# Patient Record
Sex: Male | Born: 1965 | Race: Black or African American | Hispanic: No | Marital: Single | Smoking: Never smoker
Health system: Southern US, Community
[De-identification: ages and names within clinical notes are randomized; demographics above are authoritative.]

## PROBLEM LIST (undated history)

## (undated) DIAGNOSIS — F29 Unspecified psychosis not due to a substance or known physiological condition: Secondary | ICD-10-CM

## (undated) DIAGNOSIS — E785 Hyperlipidemia, unspecified: Secondary | ICD-10-CM

## (undated) DIAGNOSIS — F1910 Other psychoactive substance abuse, uncomplicated: Secondary | ICD-10-CM

## (undated) DIAGNOSIS — M71122 Other infective bursitis, left elbow: Secondary | ICD-10-CM

## (undated) DIAGNOSIS — F32A Depression, unspecified: Secondary | ICD-10-CM

## (undated) DIAGNOSIS — A549 Gonococcal infection, unspecified: Secondary | ICD-10-CM

## (undated) DIAGNOSIS — F319 Bipolar disorder, unspecified: Secondary | ICD-10-CM

## (undated) DIAGNOSIS — G43909 Migraine, unspecified, not intractable, without status migrainosus: Secondary | ICD-10-CM

## (undated) DIAGNOSIS — F329 Major depressive disorder, single episode, unspecified: Secondary | ICD-10-CM

## (undated) DIAGNOSIS — K635 Polyp of colon: Secondary | ICD-10-CM

## (undated) DIAGNOSIS — F22 Delusional disorders: Secondary | ICD-10-CM

## (undated) DIAGNOSIS — G40909 Epilepsy, unspecified, not intractable, without status epilepticus: Secondary | ICD-10-CM

## (undated) DIAGNOSIS — M1990 Unspecified osteoarthritis, unspecified site: Secondary | ICD-10-CM

## (undated) DIAGNOSIS — E119 Type 2 diabetes mellitus without complications: Secondary | ICD-10-CM

## (undated) HISTORY — DX: Unspecified osteoarthritis, unspecified site: M19.90

## (undated) HISTORY — DX: Delusional disorders: F22

## (undated) HISTORY — DX: Polyp of colon: K63.5

## (undated) HISTORY — PX: ESOPHAGOGASTRODUODENOSCOPY: SHX1529

## (undated) HISTORY — PX: SKULL FRACTURE ELEVATION: SHX781

## (undated) HISTORY — DX: Gonococcal infection, unspecified: A54.9

## (undated) HISTORY — DX: Hyperlipidemia, unspecified: E78.5

## (undated) HISTORY — DX: Depression, unspecified: F32.A

## (undated) HISTORY — DX: Epilepsy, unspecified, not intractable, without status epilepticus: G40.909

## (undated) HISTORY — DX: Major depressive disorder, single episode, unspecified: F32.9

## (undated) HISTORY — DX: Unspecified psychosis not due to a substance or known physiological condition: F29

## (undated) HISTORY — DX: Other psychoactive substance abuse, uncomplicated: F19.10

---

## 1998-01-13 ENCOUNTER — Emergency Department (HOSPITAL_COMMUNITY): Admission: EM | Admit: 1998-01-13 | Discharge: 1998-01-13 | Payer: Self-pay | Admitting: Emergency Medicine

## 1998-01-14 ENCOUNTER — Emergency Department (HOSPITAL_COMMUNITY): Admission: EM | Admit: 1998-01-14 | Discharge: 1998-01-14 | Payer: Self-pay | Admitting: Emergency Medicine

## 1998-02-09 ENCOUNTER — Ambulatory Visit (HOSPITAL_COMMUNITY)
Admission: RE | Admit: 1998-02-09 | Discharge: 1998-02-09 | Payer: Self-pay | Admitting: Thoracic Surgery (Cardiothoracic Vascular Surgery)

## 2001-04-22 ENCOUNTER — Emergency Department (HOSPITAL_COMMUNITY): Admission: EM | Admit: 2001-04-22 | Discharge: 2001-04-22 | Payer: Self-pay | Admitting: Emergency Medicine

## 2001-04-22 ENCOUNTER — Encounter: Payer: Self-pay | Admitting: Emergency Medicine

## 2001-04-24 ENCOUNTER — Emergency Department (HOSPITAL_COMMUNITY): Admission: EM | Admit: 2001-04-24 | Discharge: 2001-04-24 | Payer: Self-pay | Admitting: *Deleted

## 2001-05-01 ENCOUNTER — Emergency Department (HOSPITAL_COMMUNITY): Admission: EM | Admit: 2001-05-01 | Discharge: 2001-05-01 | Payer: Self-pay | Admitting: Emergency Medicine

## 2001-12-11 ENCOUNTER — Encounter: Payer: Self-pay | Admitting: Emergency Medicine

## 2001-12-11 ENCOUNTER — Emergency Department (HOSPITAL_COMMUNITY): Admission: EM | Admit: 2001-12-11 | Discharge: 2001-12-11 | Payer: Self-pay | Admitting: Emergency Medicine

## 2002-02-06 ENCOUNTER — Emergency Department (HOSPITAL_COMMUNITY): Admission: EM | Admit: 2002-02-06 | Discharge: 2002-02-07 | Payer: Self-pay | Admitting: Emergency Medicine

## 2002-09-09 ENCOUNTER — Emergency Department (HOSPITAL_COMMUNITY): Admission: EM | Admit: 2002-09-09 | Discharge: 2002-09-09 | Payer: Self-pay | Admitting: Emergency Medicine

## 2002-09-25 ENCOUNTER — Emergency Department (HOSPITAL_COMMUNITY): Admission: EM | Admit: 2002-09-25 | Discharge: 2002-09-25 | Payer: Self-pay | Admitting: Emergency Medicine

## 2002-10-19 ENCOUNTER — Emergency Department (HOSPITAL_COMMUNITY): Admission: EM | Admit: 2002-10-19 | Discharge: 2002-10-19 | Payer: Self-pay | Admitting: Emergency Medicine

## 2002-11-12 ENCOUNTER — Emergency Department (HOSPITAL_COMMUNITY): Admission: EM | Admit: 2002-11-12 | Discharge: 2002-11-12 | Payer: Self-pay | Admitting: Emergency Medicine

## 2002-11-21 ENCOUNTER — Emergency Department (HOSPITAL_COMMUNITY): Admission: EM | Admit: 2002-11-21 | Discharge: 2002-11-21 | Payer: Self-pay | Admitting: *Deleted

## 2002-12-17 ENCOUNTER — Emergency Department (HOSPITAL_COMMUNITY): Admission: EM | Admit: 2002-12-17 | Discharge: 2002-12-17 | Payer: Self-pay | Admitting: Emergency Medicine

## 2003-01-16 ENCOUNTER — Emergency Department (HOSPITAL_COMMUNITY): Admission: EM | Admit: 2003-01-16 | Discharge: 2003-01-16 | Payer: Self-pay

## 2005-07-16 ENCOUNTER — Emergency Department (HOSPITAL_COMMUNITY): Admission: EM | Admit: 2005-07-16 | Discharge: 2005-07-16 | Payer: Self-pay | Admitting: Emergency Medicine

## 2007-02-19 ENCOUNTER — Emergency Department (HOSPITAL_COMMUNITY): Admission: EM | Admit: 2007-02-19 | Discharge: 2007-02-19 | Payer: Self-pay | Admitting: Emergency Medicine

## 2009-05-20 ENCOUNTER — Emergency Department (HOSPITAL_COMMUNITY): Admission: EM | Admit: 2009-05-20 | Discharge: 2009-05-20 | Payer: Self-pay | Admitting: Emergency Medicine

## 2009-05-20 ENCOUNTER — Emergency Department (HOSPITAL_COMMUNITY): Admission: EM | Admit: 2009-05-20 | Discharge: 2009-05-21 | Payer: Self-pay | Admitting: Emergency Medicine

## 2009-05-28 ENCOUNTER — Emergency Department (HOSPITAL_COMMUNITY): Admission: EM | Admit: 2009-05-28 | Discharge: 2009-05-28 | Payer: Self-pay | Admitting: Emergency Medicine

## 2009-06-03 ENCOUNTER — Emergency Department (HOSPITAL_COMMUNITY): Admission: EM | Admit: 2009-06-03 | Discharge: 2009-06-03 | Payer: Self-pay | Admitting: Emergency Medicine

## 2009-06-08 ENCOUNTER — Emergency Department (HOSPITAL_COMMUNITY): Admission: EM | Admit: 2009-06-08 | Discharge: 2009-06-08 | Payer: Self-pay | Admitting: Emergency Medicine

## 2009-07-03 ENCOUNTER — Emergency Department (HOSPITAL_COMMUNITY): Admission: EM | Admit: 2009-07-03 | Discharge: 2009-07-03 | Payer: Self-pay | Admitting: Emergency Medicine

## 2009-07-23 ENCOUNTER — Emergency Department (HOSPITAL_COMMUNITY): Admission: EM | Admit: 2009-07-23 | Discharge: 2009-07-23 | Payer: Self-pay | Admitting: Emergency Medicine

## 2009-08-01 ENCOUNTER — Emergency Department (HOSPITAL_COMMUNITY): Admission: EM | Admit: 2009-08-01 | Discharge: 2009-08-01 | Payer: Self-pay | Admitting: Emergency Medicine

## 2009-08-04 ENCOUNTER — Emergency Department (HOSPITAL_COMMUNITY): Admission: EM | Admit: 2009-08-04 | Discharge: 2009-08-05 | Payer: Self-pay | Admitting: Emergency Medicine

## 2009-08-05 ENCOUNTER — Encounter (INDEPENDENT_AMBULATORY_CARE_PROVIDER_SITE_OTHER): Payer: Self-pay | Admitting: *Deleted

## 2009-08-31 ENCOUNTER — Encounter (INDEPENDENT_AMBULATORY_CARE_PROVIDER_SITE_OTHER): Payer: Self-pay | Admitting: *Deleted

## 2009-08-31 ENCOUNTER — Ambulatory Visit: Payer: Self-pay | Admitting: Internal Medicine

## 2009-09-02 ENCOUNTER — Telehealth: Payer: Self-pay | Admitting: Internal Medicine

## 2009-09-03 ENCOUNTER — Telehealth: Payer: Self-pay | Admitting: Internal Medicine

## 2009-09-04 ENCOUNTER — Ambulatory Visit: Payer: Self-pay | Admitting: Internal Medicine

## 2009-09-04 HISTORY — PX: COLONOSCOPY: SHX174

## 2009-09-08 ENCOUNTER — Encounter: Payer: Self-pay | Admitting: Internal Medicine

## 2009-09-11 ENCOUNTER — Emergency Department (HOSPITAL_COMMUNITY): Admission: EM | Admit: 2009-09-11 | Discharge: 2009-09-11 | Payer: Self-pay | Admitting: Emergency Medicine

## 2009-09-16 ENCOUNTER — Telehealth: Payer: Self-pay | Admitting: Internal Medicine

## 2009-09-21 ENCOUNTER — Telehealth: Payer: Self-pay | Admitting: Internal Medicine

## 2009-09-21 DIAGNOSIS — F329 Major depressive disorder, single episode, unspecified: Secondary | ICD-10-CM | POA: Insufficient documentation

## 2009-09-21 DIAGNOSIS — R079 Chest pain, unspecified: Secondary | ICD-10-CM

## 2009-09-21 DIAGNOSIS — R519 Headache, unspecified: Secondary | ICD-10-CM | POA: Insufficient documentation

## 2009-09-21 DIAGNOSIS — R51 Headache: Secondary | ICD-10-CM

## 2009-09-22 ENCOUNTER — Telehealth: Payer: Self-pay | Admitting: Internal Medicine

## 2009-09-22 ENCOUNTER — Telehealth: Payer: Self-pay | Admitting: Cardiovascular Disease

## 2009-09-29 ENCOUNTER — Encounter: Admission: RE | Admit: 2009-09-29 | Discharge: 2009-09-29 | Payer: Self-pay | Admitting: Geriatric Medicine

## 2009-09-29 ENCOUNTER — Ambulatory Visit: Payer: Self-pay | Admitting: Cardiovascular Disease

## 2009-10-02 ENCOUNTER — Emergency Department (HOSPITAL_COMMUNITY): Admission: EM | Admit: 2009-10-02 | Discharge: 2009-10-02 | Payer: Self-pay | Admitting: Emergency Medicine

## 2009-10-09 ENCOUNTER — Emergency Department (HOSPITAL_COMMUNITY): Admission: EM | Admit: 2009-10-09 | Discharge: 2009-10-09 | Payer: Self-pay | Admitting: Emergency Medicine

## 2009-10-20 ENCOUNTER — Encounter (INDEPENDENT_AMBULATORY_CARE_PROVIDER_SITE_OTHER): Payer: Self-pay | Admitting: *Deleted

## 2009-10-21 ENCOUNTER — Ambulatory Visit (HOSPITAL_COMMUNITY): Admission: RE | Admit: 2009-10-21 | Discharge: 2009-10-21 | Payer: Self-pay | Admitting: Specialist

## 2009-10-26 ENCOUNTER — Telehealth: Payer: Self-pay | Admitting: Internal Medicine

## 2009-10-29 ENCOUNTER — Emergency Department (HOSPITAL_COMMUNITY): Admission: EM | Admit: 2009-10-29 | Discharge: 2009-10-29 | Payer: Self-pay | Admitting: Emergency Medicine

## 2009-11-02 ENCOUNTER — Ambulatory Visit: Payer: Self-pay | Admitting: Internal Medicine

## 2009-11-02 ENCOUNTER — Ambulatory Visit (HOSPITAL_COMMUNITY): Admission: RE | Admit: 2009-11-02 | Discharge: 2009-11-02 | Payer: Self-pay | Admitting: Cardiovascular Disease

## 2009-11-02 ENCOUNTER — Ambulatory Visit: Payer: Self-pay | Admitting: Infectious Disease

## 2009-11-02 ENCOUNTER — Encounter: Payer: Self-pay | Admitting: Cardiovascular Disease

## 2009-11-02 ENCOUNTER — Ambulatory Visit: Payer: Self-pay

## 2009-11-02 DIAGNOSIS — F29 Unspecified psychosis not due to a substance or known physiological condition: Secondary | ICD-10-CM | POA: Insufficient documentation

## 2009-11-02 DIAGNOSIS — F1910 Other psychoactive substance abuse, uncomplicated: Secondary | ICD-10-CM | POA: Insufficient documentation

## 2009-11-02 DIAGNOSIS — L290 Pruritus ani: Secondary | ICD-10-CM

## 2009-11-11 ENCOUNTER — Ambulatory Visit: Payer: Self-pay | Admitting: Infectious Disease

## 2009-11-11 LAB — CONVERTED CEMR LAB
Albumin: 4.5 g/dL (ref 3.5–5.2)
Alkaline Phosphatase: 102 units/L (ref 39–117)
BUN: 11 mg/dL (ref 6–23)
Basophils Relative: 1 % (ref 0–1)
Chloride: 105 meq/L (ref 96–112)
Eosinophils Absolute: 0.2 10*3/uL (ref 0.0–0.7)
HCT: 38.4 % — ABNORMAL LOW (ref 39.0–52.0)
HCV Ab: NEGATIVE
HIV 1 RNA Quant: <48 copies/mL (ref <48)
HIV-1 RNA Quant, Log: <1.68 (ref <1.68)
Hemoglobin: 12.5 g/dL — ABNORMAL LOW (ref 13.0–17.0)
Hepatitis B-Post: 0 milliintl units/mL
Lymphs Abs: 3.2 10*3/uL (ref 0.7–4.0)
MCV: 89.5 fL (ref 78.0–100.0)
Monocytes Absolute: 0.6 10*3/uL (ref 0.1–1.0)
Monocytes Relative: 10 % (ref 3–12)
RBC: 4.29 M/uL (ref 4.22–5.81)
Total Bilirubin: 0.3 mg/dL (ref 0.3–1.2)
Total Protein: 7.2 g/dL (ref 6.0–8.3)
WBC: 6.3 10*3/uL (ref 4.0–10.5)

## 2009-11-15 ENCOUNTER — Emergency Department (HOSPITAL_COMMUNITY): Admission: EM | Admit: 2009-11-15 | Discharge: 2009-11-15 | Payer: Self-pay | Admitting: Emergency Medicine

## 2009-11-16 ENCOUNTER — Telehealth: Payer: Self-pay | Admitting: Infectious Disease

## 2009-11-19 ENCOUNTER — Telehealth: Payer: Self-pay | Admitting: Internal Medicine

## 2009-11-19 ENCOUNTER — Emergency Department (HOSPITAL_COMMUNITY): Admission: EM | Admit: 2009-11-19 | Discharge: 2009-11-19 | Payer: Self-pay | Admitting: Emergency Medicine

## 2009-12-23 ENCOUNTER — Ambulatory Visit: Payer: Self-pay | Admitting: Internal Medicine

## 2010-01-25 ENCOUNTER — Emergency Department (HOSPITAL_COMMUNITY): Admission: EM | Admit: 2010-01-25 | Discharge: 2010-01-25 | Payer: Self-pay | Admitting: Emergency Medicine

## 2010-02-12 ENCOUNTER — Emergency Department (HOSPITAL_COMMUNITY): Admission: EM | Admit: 2010-02-12 | Discharge: 2010-02-12 | Payer: Self-pay | Admitting: Emergency Medicine

## 2010-02-20 ENCOUNTER — Emergency Department (HOSPITAL_COMMUNITY): Admission: EM | Admit: 2010-02-20 | Discharge: 2010-02-20 | Payer: Self-pay | Admitting: Emergency Medicine

## 2010-02-23 ENCOUNTER — Encounter: Admission: RE | Admit: 2010-02-23 | Discharge: 2010-02-23 | Payer: Self-pay | Admitting: Family Medicine

## 2010-02-23 ENCOUNTER — Emergency Department (HOSPITAL_COMMUNITY): Admission: EM | Admit: 2010-02-23 | Discharge: 2010-02-23 | Payer: Self-pay | Admitting: Emergency Medicine

## 2010-04-19 ENCOUNTER — Emergency Department (HOSPITAL_COMMUNITY): Admission: EM | Admit: 2010-04-19 | Discharge: 2010-04-19 | Payer: Self-pay | Admitting: Emergency Medicine

## 2010-04-20 ENCOUNTER — Emergency Department (HOSPITAL_COMMUNITY): Admission: EM | Admit: 2010-04-20 | Discharge: 2010-04-20 | Payer: Self-pay | Admitting: Family Medicine

## 2010-05-16 ENCOUNTER — Emergency Department (HOSPITAL_COMMUNITY): Admission: EM | Admit: 2010-05-16 | Discharge: 2010-05-16 | Payer: Self-pay | Admitting: Emergency Medicine

## 2010-05-22 ENCOUNTER — Emergency Department (HOSPITAL_COMMUNITY): Admission: EM | Admit: 2010-05-22 | Discharge: 2010-05-22 | Payer: Self-pay | Admitting: Emergency Medicine

## 2010-05-31 ENCOUNTER — Telehealth: Payer: Self-pay | Admitting: Internal Medicine

## 2010-08-22 ENCOUNTER — Encounter: Payer: Self-pay | Admitting: Geriatric Medicine

## 2010-09-02 NOTE — Procedures (Signed)
Summary: Colonoscopy  Patient: Jeremiah Mcguire Note: All result statuses are Final unless otherwise noted.  Tests: (1) Colonoscopy (COL)   COL Colonoscopy           DONE (C)     [facility]     [address]     [place], [place]  [address]           COLONOSCOPY PROCEDURE REPORT           PATIENT:  Jeremiah Mcguire, Jeremiah Mcguire  MR#:  [id number]     BIRTHDATE:  [birth year], 43 yrs. old  GENDER:  male           ENDOSCOPIST:  Iva Boop, MD, [facility]     Referred by:           Emergency Department           PROCEDURE DATE:  09/04/2009     PROCEDURE:  Colonoscopy with biopsy     ASA CLASS:  Class I     INDICATIONS:  rectal pain     he is also 43 and African-American so total colonoscopy planned     so that he will be screened for colorectalpolyps/cancer           MEDICATIONS:   Fentanyl 50 mg IV, Versed 7 mg IV           DESCRIPTION OF PROCEDURE:   After the risks benefits and     alternatives of the procedure were thoroughly explained, informed     consent was obtained.  Digital rectal exam was performed and     revealed no abnormalities and normal prostate.   The LB CF-H180AL     [id number] endoscope was introduced through the anus and advanced to     the cecum, which was identified by both the appendix and ileocecal     valve, without limitations.  The quality of the prep was     excellent, using MoviPrep.  The instrument was then slowly     withdrawn as the colon was fully examined.     Insertion: 2:08 minutes Withdrawal: 9:57 minutes ADDENDUM: Rectal     photo inadvertantly omitted     <<PROCEDUREIMAGES>>           FINDINGS:  Three polyps were found in the left colon. One in     descending and two in sigmoid. Minimum 2mm and maximum 4 mm size.     The polyps were removed using cold biopsy forceps.  This was     otherwise a normal examination of the colon.   Retroflexed views     in the rectum revealed no abnormalities.    The scope was then     withdrawn from the patient  and the procedure completed.           COMPLICATIONS:  None           ENDOSCOPIC IMPRESSION:     1) Three diminutive polyps in the left colon - removed     2) Otherwise normal examination, excellent prep           3) No cause of his pain or other somatosensory symptoms seen. Do     not think kis problems orginate from the gastrointestinal tract.           RECOMMENDATIONS:     Return to primary care physician for further evaluation. ? if     some or all of symproms are related to medications.  REPEAT EXAM:  In for Colonoscopy, pending biopsy results.           Iva Boop, MD, [facility]           CC:  The Patient     Reymundo Poll, MD           n.     REVISED:  09/04/2009 12:30 PM     eSIGNED:   Iva Boop at 09/04/2009 12:30 PM           Roslynn Amble, [id number]  Note: An exclamation mark (!) indicates a result that was not dispersed into the flowsheet. Document Creation Date: 09/04/2009 12:30 PM _______________________________________________________________________  (1) Order result status: Final Collection or observation date-time: 09/04/2009 12:15 Requested date-time:  Receipt date-time:  Reported date-time:  Referring Physician:   Ordering Physician: Stan Head [phone]) Specimen Source:  Source: Launa Grill Order Number: [id number] Lab site:   Appended Document: Colonoscopy     Procedures Next Due Date:    Colonoscopy: 09/2019

## 2010-09-02 NOTE — Letter (Signed)
Summary: [facility] Instructions  [facility]  [address] [place], [place] [address]   Phone: [phone]  Fax: [phone]       Jeremiah Mcguire    02-Jun-1966    MRN: [id number]        Procedure Day Dorna Bloom: Friday, 09/04/09     Arrival Time: 10:30 am      Procedure Time: 11:30 am     Location of Procedure:                    _X_  [facility] (4th Floor)                       PREPARATION FOR COLONOSCOPY WITH MOVIPREP   Starting 5 days prior to your procedure TODAY do not eat nuts, seeds, popcorn, corn, beans, peas,  salads, or any raw vegetables.  Do not take any fiber supplements (e.g. Metamucil, Citrucel, and Benefiber).  THE DAY BEFORE YOUR PROCEDURE         DATE: 09/03/09     DAY: Thursday  1.  Drink clear liquids the entire day-NO SOLID FOOD  2.  Do not drink anything colored red or purple.  Avoid juices with pulp.  No orange juice.  3.  Drink at least 64 oz. (8 glasses) of fluid/clear liquids during the day to prevent dehydration and help the prep work efficiently.  CLEAR LIQUIDS INCLUDE: Water Jello Ice Popsicles Tea (sugar ok, no milk/cream) Powdered fruit flavored drinks Coffee (sugar ok, no milk/cream) Gatorade Juice: apple, white grape, white cranberry  Lemonade Clear bullion, consomm, broth Carbonated beverages (any kind) Strained chicken noodle soup Hard Candy                             4.  In the morning, mix first dose of MoviPrep solution:    Empty 1 Pouch A and 1 Pouch B into the disposable container    Add lukewarm drinking water to the top line of the container. Mix to dissolve    Refrigerate (mixed solution should be used within 24 hrs)  5.  Begin drinking the prep at 5:00 p.m. The MoviPrep container is divided by 4 marks.   Every 15 minutes drink the solution down to the next mark (approximately 8 oz) until the full liter is complete.   6.  Follow completed prep with 16 oz of clear liquid of your choice  (Nothing red or purple).  Continue to drink clear liquids until bedtime.  7.  Before going to bed, mix second dose of MoviPrep solution:    Empty 1 Pouch A and 1 Pouch B into the disposable container    Add lukewarm drinking water to the top line of the container. Mix to dissolve    Refrigerate  THE DAY OF YOUR PROCEDURE      DATE: 09/04/09    DAY: Friday  Beginning at 6:30a.m. (5 hours before procedure):         1. Every 15 minutes, drink the solution down to the next mark (approx 8 oz) until the full liter is complete.  2. Follow completed prep with 16 oz. of clear liquid of your choice.    3. You may drink clear liquids until 9:30am (2 HOURS BEFORE PROCEDURE).   MEDICATION INSTRUCTIONS  Unless otherwise instructed, you should take regular prescription medications with a small sip of water   as early as possible  the morning of your procedure.  Additional medication instructions: None         OTHER INSTRUCTIONS  You will need a responsible adult at least 47 years of age to accompany you and drive you home.   This person must remain in the waiting room during your procedure.  Wear loose fitting clothing that is easily removed.  Leave jewelry and other valuables at home.  However, you may wish to bring a book to read or  an iPod/MP3 player to listen to music as you wait for your procedure to start.  Remove all body piercing jewelry and leave at home.  Total time from sign-in until discharge is approximately 2-3 hours.  You should go home directly after your procedure and rest.  You can resume normal activities the  day after your procedure.  The day of your procedure you should not:   Drive   Make legal decisions   Operate machinery   Drink alcohol   Return to work  You will receive specific instructions about eating, activities and medications before you leave.    The above instructions have been reviewed and explained to me by   Francee Piccolo CMA  Duncan Dull)  August 31, 2009 11:21 AM    I fully understand and can verbalize these instructions _____________________________ Date _________

## 2010-09-02 NOTE — Letter (Signed)
Summary: Patient Notice-Hyperplastic Polyps  [facility]  [address] [place], [place] [address]   Phone: [phone]  Fax: [phone]        September 08, 2009 MRN: [id number]    Jeremiah Mcguire [address] [place], [place]  [address]    Dear Jeremiah Mcguire,  I am pleased to inform you that the colon polyp(s) removed during your recent colonoscopy was (were) found to be hyperplastic.  These types of polyps are NOT pre-cancerous.  It is therefore my recommendation that you have a repeat colonoscopy examination in 10 years for routine colorectal cancer screening.  Should you develop new or worsening symptoms of abdominal pain, bowel habit changes or bleeding from the rectum or bowels, please schedule an evaluation with either your primary care physician or with me.  No further action with gastroenterology is needed at this time.      Please follow-up with your primary care physician for your other      healthcare needs.  Please call us if you are having persistent problems or have questions about your condition that have not been fully answered at this time.  Sincerely,  Iva Boop MD, [facility] This letter has been electronically signed by your physician.  Appended Document: Patient Notice-Hyperplastic Polyps letter mailed 2.11.11

## 2010-09-02 NOTE — Progress Notes (Signed)
Summary: doctor switch request  Phone Note Call from Patient Call back at Home Phone [phone]   Caller: Patient Call For: Dr. Leone Payor Reason for Call: Talk to Nurse Summary of Call: pt called to sch an appt for rectal bleeding and sharp/stiinging rectal pain... upon going to sch pt, saw that pt has been discharged from [facility] practice although no notes in EMR confirming such... Sheri advised to tell pt that he must have PCP contact Dr. Leone Payor directly for future appts... pt then said that he didnt want to see Dr. Leone Payor because Dr. Leone Payor was "saying things he didnt want to hear"... pt asked to be scheduled with someone else within our practice Initial call taken by: Vallarie Mare,  May 31, 2010 9:08 AM  Follow-up for Phone Call        Dr Leone Payor I have not returned a call to the patient.  We need to clarify if he has been discharged from out practice.  Please advise. 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 He should be seen only if primary care had screened him first. I would not let him switch to  anyone else - best if I handle it at this time since I know whole history. If he wants to switch he will have to find another practice  to see.   Additional Follow-up by: Iva Boop MD, [facility],  June 01, 2010 5:14 AM    Additional Follow-up for Phone Call Additional follow up Details #2::    Patient  advised that he will not be able to switch physicians in our practice.  He states he does not want to see Dr Leone Payor but wants to be seen in our practice.  Patient  is advised he will need to find another GI practice if that is his desire.  He is advised to see his primary care or check the phone book for another GI practice.   Follow-up by: Darcey Nora RN, CGRN,  June 01, 2010 9:04 AM

## 2010-09-02 NOTE — Assessment & Plan Note (Signed)
Summary: ABDOMINAL PAIN--CH.   History of Present Illness Visit Type: new patient  Primary GI MD: Stan Head MD [facility] [place] Primary Provider: Reymundo Poll, MD  Requesting Provider: n/a Chief Complaint: rectal pain History of Present Illness:   This is a 45 year old African American man I was asked to see regarding rectal pain. He was evaluated in the [facility] emergency department on August 01, 2009 with a normal rectal exam, and subsequently was seen on August 05, 2009 because he had paresthesias in his staining sensation all over his body. Laboratory testing and showed a normal CBC urinalysis, drug screen was positive for THC. Seeing that was normal except for AST elevated at 38, only one .abnormal. Alcohol less than 5. He has been preoccupied that he has worse crawling over his body. He believes that his burning sensation he feels from his scalp going down into his body and including his rectal and buttock area is related to this. It began since he was released from prison in September October 2010. Apparently he has taken multiple rounds of Vermox and other anti-helminthic medications.  He also describes a bubbling up inside chest at times.  3 weeks ago - has not recurred - felt clogged up in his throat- not while eating. this occurred and remitted spontaneously.  No rectal bleeding, discharge or change in bowels. No abdominal pain. All other GI ROS negative.            Current Medications (verified): 1)  Hydroxyzine Pamoate 50 Mg Caps (Hydroxyzine Pamoate) .... One Capsule By Mouth At Bedtime 2)  Seroquel 300 Mg Tabs (Quetiapine Fumarate) .... One Tablet By Mouth Two Times A Day 3)  Benztropine Mesylate 1 Mg Tabs (Benztropine Mesylate) .... One Tablet By Mouth Two Times A Day  Allergies (verified): No Known Drug Allergies  Past History:  Past Medical History: Chronic Headaches Depression  Past Surgical History: Surgery on Head x 3   Family History: No FH of Colon  Cancer: Family History of Diabetes: Maternal Uncle  Stomach Cancer: Maternal Cousin  Throat Cancer: Maternal Uncle  Social History: Occupation: Disability Incarcerated 3 times, last release 10/10 No children Patient has never smoked.  Alcohol Use - no Illicit Drug Use - yes- marijuana Smoking Status:  never Drug Use:  no  Review of Systems       The patient complains of back pain, change in vision, confusion, cough, depression-new, headaches-new, hearing problems, itching, muscle pains/cramps, nosebleeds, shortness of breath, skin rash, sleeping problems, sore throat, and swelling of feet/legs.         All other ROS negative except as per HPI.   Vital Signs:  Patient profile:   45 year old male Height:      71 inches Weight:      243 pounds BMI:     34.01 BSA:     2.29 Pulse rate:   76 / minute Pulse rhythm:   regular BP sitting:   124 / 82  (left arm) Cuff size:   regular  Vitals Entered By: Ok Anis CMA (August 31, 2009 10:30 AM)  Physical Exam  General:  Well developed, well nourished, no acute distress. Eyes:  PERRLA, no icterus. Mouth:  No deformity or lesions, dentition normal. Lungs:  Clear throughout to auscultation. Heart:  Regular rate and rhythm; no murmurs, rubs,  or bruits. Abdomen:  Soft, nontender and nondistended. No masses, hepatosplenomegaly or hernias noted. Normal bowel sounds. Rectal:  deferred until time of colonoscopy.   peranal inspection without  lesions Extremities:  No edema  Psych:  answers ?'s appropriately ? some bizarre thought re" pre-ocupation with worms cooperative and pleasant   Impression & Recommendations:  Problem # 1:  RECTAL PAIN (ONG-295.28) Assessment New parasthesia-like pain ? related to meds - ? if he is actually schizophrenic ? transient throat-closing is a s ide-effect of meds also  will investigate with endoscopy to rule-out other problems, plus he is 43 and reasoable to screen for colon cancer Risks,  benefits,and indications of endoscopic procedure(s) were reviewed with the patient and all questions answered. Will try to reach ACT Team or his psychiatrist/PCP about this   Orders: Colonoscopy (Colon)  Patient Instructions: 1)  Moviprep instructions for colonoscopy provided. 2)  The medication list was reviewed and reconciled.  All changed / newly prescribed medications were explained.  A complete medication list was provided to the patient / caregiver.

## 2010-09-02 NOTE — Progress Notes (Signed)
Summary: Triage  Phone Note Call from Patient Call back at Home Phone [phone]   Caller: Patient Call For: Dr. Leone Payor Reason for Call: Talk to Nurse Summary of Call: Pt has some questions about his colonoscopy and he still feels "Parasites" and does not understand why Dr. Leone Payor did not find anything with his colonoscopy Initial call taken by: Karna Christmas,  September 22, 2009 10:27 AM  Follow-up for Phone Call        Pt. states he has parasites in his body and they are spreading into his head and they are biting him all over. Insists that Dr.Philbert Ocallaghan needs to find these parasites. Pt. states that he is taking parasite medicine and was told by another MD that he had them. I advise pt. that from a GI standpoint he is clear of parasites and advised him to return to the MD who told him he had the parasites for further treatment. Pt. instructed to call back as needed.   DR.GEESSNER PLEASE FURTHER ADVISE  Follow-up by: Laureen Ochs LPN,  September 22, 2009 10:47 AM  Additional Follow-up for Phone Call Additional follow up Details #1::        you gave correct advice will see if he has seen his psych provider I will have Judeth Cornfield follow-up as we know him thanks Additional Follow-up by: Iva Boop MD, [facility],  September 22, 2009 2:04 PM    Additional Follow-up for Phone Call Additional follow up Details #2::    Note forwarded to Francee Piccolo CMA Follow-up by: Laureen Ochs LPN,  September 22, 2009 2:08 PM  Additional Follow-up for Phone Call Additional follow up Details #3:: Details for Additional Follow-up Action Taken: I spoke to Cort with the ACT team.  He states that Mr. Garrels is following up with Dr. Electa Sniff.  Cort takes Mr. Wessells his medications weekly.  He also states that the pt is taking risperdal and dilantin, which we do not have on our list.  Cort will give Dr. Electa Sniff a message to call Dr. Leone Payor on 09/24/09 to discuss patient. Francee Piccolo CMA [facility])   September 22, 2009 4:42 PM  Iva Boop MD, [facility]  September 22, 2009 7:28 PM   New/Updated Medications: * RISPERDAL (UNKNOWN DOSE)  * DILANTIN (UNKNOW DOSE)

## 2010-09-02 NOTE — Letter (Signed)
Summary: New Patient letter  [facility]  [address] [place], [place] [address]   Phone: [phone]  Fax: [phone]       08/[birth year]/2009 MRN: [id number]  Jeremiah Mcguire [address] [place], [place]  [address]  Dear Jeremiah Mcguire,  Welcome to the Gastroenterology Division at [facility].    You are scheduled to see Dr. Leone Payor on 08-31-09 at 10:30a.m. on the 3rd floor at [facility], [address]  We ask that you try to arrive at our office 15 minutes prior to your appointment time to allow for check-in.  We would like you to complete the enclosed self-administered evaluation form prior to your visit and bring it with you on the day of your appointment.  We will review it with you.  Also, please bring a complete list of all your medications or, if you prefer, bring the medication bottles and we will list them.  Please bring your insurance card so that we may make a copy of it.  If your insurance requires a referral to see a specialist, please bring your referral form from your primary care physician.  Co-payments are due at the time of your visit and may be paid by cash, check or credit card.     Your office visit will consist of a consult with your physician (includes a physical exam), any laboratory testing he/she may order, scheduling of any necessary diagnostic testing (e.g. x-ray, ultrasound, CT-scan), and scheduling of a procedure (e.g. Endoscopy, Colonoscopy) if required.  Please allow enough time on your schedule to allow for any/all of these possibilities.    If you cannot keep your appointment, please call [phone] to cancel or reschedule prior to your appointment date.  This allows [place] the opportunity to schedule an appointment for another patient in need of care.  If you do not cancel or reschedule by 5 p.m. the business day prior to your appointment date, you will be charged a $50.00 late cancellation/no-show fee.    Thank you for choosing  [facility] for your medical needs.  We appreciate the opportunity to care for you.  Please visit [place] at our website  to learn more about our practice.                     Sincerely,                                                             The Gastroenterology Division

## 2010-09-02 NOTE — Progress Notes (Signed)
Summary: still feels parasites  Phone Note Call from Patient Call back at Home Phone [phone]   Caller: Patient Call For: Dr. Leone Payor Reason for Call: Talk to Nurse Summary of Call: pt would like to speak with a nursei again regarding his COL results... pt says he still feels "parasites" and he doesnt understand why his results say "Dr. Leone Payor found nothing" because he "knows there's something in there"... asked pt is he ever contacted his PCP like Sheri had advised at last phone conversation... pt just said "no" Initial call taken by: Vallarie Mare,  September 21, 2009 8:58 AM  Follow-up for Phone Call        No answer and no machine, I will continue to try and reach patient Darcey Nora RN, [facility]  September 21, 2009 9:29 AM  No answer and no machine, I will continue to try and reach patient Darcey Nora RN, [facility]  September 21, 2009 11:32 AM  I have left a message I have returned his call with a male that answered the above number. Darcey Nora RN, [facility]  September 21, 2009 1:52 PM  Additional Follow-up for Phone Call Additional follow up Details #1::        See triage note dated 09-22-09. Additional Follow-up by: Laureen Ochs LPN,  September 22, 2009 4:32 PM

## 2010-09-02 NOTE — Assessment & Plan Note (Signed)
Summary: discuss EGD to look for worms/sheri   History of Present Illness Visit Type: Follow-up Visit Primary GI MD: Stan Head MD [facility] Primary Provider: Reymundo Poll, MD  Requesting Provider: n/a Chief Complaint: discuss EGD look for worms History of Present Illness:   45 yo African-American man here to discuss evaluation for his complaints of parasites in his body. He says that he has white worms crawling under his skin, can feel them wiggling in his gluteal area, and has seen them exit his ear and penis. None per rectum as he has claimed in the past. I have previously evaluated him with a colonoscopy due to ? of abnormal colon on CT.    GI Review of Systems      Denies abdominal pain, acid reflux, belching, bloating, chest pain, dysphagia with liquids, dysphagia with solids, heartburn, loss of appetite, nausea, vomiting, vomiting blood, weight loss, and  weight gain.        Denies anal fissure, black tarry stools, change in bowel habit, constipation, diarrhea, diverticulosis, fecal incontinence, heme positive stool, hemorrhoids, irritable bowel syndrome, jaundice, light color stool, liver problems, rectal bleeding, and  rectal pain.    Current Medications (verified): 1)  Hydroxyzine Pamoate 50 Mg Caps (Hydroxyzine Pamoate) .... One Capsule By Mouth At Bedtime 2)  Benztropine Mesylate 1 Mg Tabs (Benztropine Mesylate) .... One Tablet By Mouth Two Times A Day 3)  Seroquel 400 Mg Tabs (Quetiapine Fumarate) .Marland Kitchen.. 1 Once Daily 4)  Dilantin 100 Mg Caps (Phenytoin Sodium Extended) .... Four Tabs Once Daily 5)  Hydrocodone-Acetaminophen 5-325 Mg Tabs (Hydrocodone-Acetaminophen) .... Take One Tab For 4 Days  Allergies (verified): 1)  ! * Vitamin C  Past History:  Past Medical History: CHEST PAIN-UNSPECIFIED (ICD-786.50) DEPRESSION (ICD-311) HEADACHE, CHRONIC (ICD-784.0) RECTAL PAIN (GMW-102.72) Delusional parasitosis Gonorrhhea Schizophrenia - suspected  Past Surgical  History: Reviewed history from 09/29/2009 and no changes required. Surgery on Head x 3 , surface wounds secondary to trauma  Family History: Reviewed history from 09/29/2009 and no changes required. No FH of Colon Cancer: Family History of Diabetes: Maternal Uncle  Stomach Cancer: Maternal Cousin  Throat Cancer: Maternal Uncle  Mother alive at age 21, healthy Father deceased, gunshot wound No premature CAD  Social History: Reviewed history from 11/02/2009 and no changes required. Occupation: Disability Incarcerated 3 times, last release 10/10 Single lives with grandmother No children Patient quit 2009 Alcohol Use - no Illicit Drug Use -marijuana use, never used cocaine, no IVDU  Vital Signs:  Patient profile:   45 year old male Height:      71 inches Weight:      240 pounds BMI:     33.59 Pulse rate:   88 / minute Pulse rhythm:   regular BP sitting:   120 / 60  (right arm)  Vitals Entered By: Chales Abrahams CMA Duncan Dull) (Dec 23, 2009 4:09 PM)  Physical Exam  General:  Well developed, well nourished, no acute distress. Skin:  palpation of buttocks, gluteal crease area on right where patient claims to have something "wiggling" beneath the skin reveals no abnormality no visible skin changes either Psych:  aggressive, agitated at times did not threaten   Impression & Recommendations:  Problem # 1:  ? of HALLUCINATIONS (ICD-780.1) Assessment Unchanged He continues with what I suspect to be tactileand visual  hallucinations with descriptions of parasites exiting body parts (ear and penis) and subcutaneous sensations. i have questioned if this could be related to his medications as opposed to psychosis. He demanded  an upper endoscopy to evaluate these and when I explained that was not indicated and that I would not perform one he told me it was wrong that I would not test him and walked out. HE DOES NOT NEED REPEATED EVALUATION FOR PARASITES, ETC. WILL TRY TO EXPLAIN TO  EMERGENCY DEPARTMENTS.  Problem # 2:  ? of SCHIZOPHRENIA (ICD-295.90) Assessment: New Seems like he has this, he is at least psychotic, ? psychotic depression.  Patient Instructions: 1)  Patient left and we were unable to provide.  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 ? is primary or secondary (likely with depression and/or schizophrenia). Will communicate this to Emergency departments so that they know his problem and try to avoid unnecessary testing. We have also contacted mental health about this before and will try again. i believe his PCP is psychiatry but it is not entirely clear.

## 2010-09-02 NOTE — Progress Notes (Signed)
Summary: prep ?'s  Phone Note Call from Patient Call back at Home Phone [phone]   Caller: Patient Call For: Dr. Leone Payor Reason for Call: Talk to Nurse Summary of Call: pt has procedure tomorrow and has questions regarding what he can and cannot eat Initial call taken by: Vallarie Mare,  September 03, 2009 2:38 PM  Follow-up for Phone Call        Explained clear liquid diet to patient in detail.  Pt. denies eating any solid food today, but did not inquire about clear liquid diet until 2:30.

## 2010-09-02 NOTE — Progress Notes (Signed)
Summary: Triage  Phone Note Call from Patient Call back at Home Phone [phone]   Caller: Patient Call For: Dr. Leone Payor Reason for Call: Talk to Nurse Summary of Call: pt has some questions about his last colonoscopy. Initial call taken by: Karna Christmas,  October 26, 2009 2:03 PM  Follow-up for Phone Call        Pt. is not home presently .messenger will have him call when he returns.   Teryl Lucy RN  October 26, 2009 2:35 PM Pt. wanted the dr. to know that he went to the er and was treated with vermox for worms and says he is also scheduled for appt. with an infectious disease dr in April. Follow-up by: Teryl Lucy RN,  October 26, 2009 3:26 PM  Additional Follow-up for Phone Call Additional follow up Details #1::        Noted Additional Follow-up by: Iva Boop MD, Clementeen Graham,  October 26, 2009 3:37 PM

## 2010-09-02 NOTE — Assessment & Plan Note (Signed)
Summary: NEW PT PARASITES/CH   Referring Provider:  n/a Primary Provider:  Reymundo Poll, MD   CC:  PATIENT C/O PARASITES and CAUSING PAIN AND ITCHING AND BURNING.  History of Present Illness: 45 yo . Pt was incarcerated April 2009 thru October of 2010. In September has been having symptoms of parasites swimming throughout his body, his legs He has burning sensation, like something is pointing "in me" 10/10 typically. Sensation of "swimming" "burning" "stinging" occur in his perianal area, his legs, his chest his arms. He says he notices when they "are hatching and making new ones." He has been seen in the ED two to three times a month for various symptoms that are "all because of the parasites." He has been treated on several occasions for pinworm though this has never been demonstrated, including a course of mebendazole. One of ther ED physicians apparently thought he might possibly have seen something in skin above the anus and gave pt his most recent rx and referral to [place] in ID. Of not he has had apparennt unrevealing Ova and parasite stool exams and his colonoscopy done by Dr. Leone Payor for rectal pain workup was negative. He claims that his PCP tested him for HIV last month.  I spent greater than 30 minutes counselling this patient on nature of mind body connection and importance of diagnosing any underlying pathology. Ladora Daniel [phone]  Problems Prior to Update: 1)  Chest Pain-unspecified  (ICD-786.50) 2)  Depression  (ICD-311) 3)  Headache, Chronic  (ICD-784.0) 4)  Rectal Pain  (UJW-119.14)  Medications Prior to Update: 1)  Hydroxyzine Pamoate 50 Mg Caps (Hydroxyzine Pamoate) .... One Capsule By Mouth At Bedtime 2)  Benztropine Mesylate 1 Mg Tabs (Benztropine Mesylate) .... One Tablet By Mouth Two Times A Day 3)  Risperdal (Unknown Dose) 4)  Dilantin 100 Mg Caps (Phenytoin Sodium Extended) .... Four Tabs Once Daily 5)  Hydrocodone-Acetaminophen 5-325 Mg Tabs  (Hydrocodone-Acetaminophen) .... Take One Tab For 4 Days  Current Medications (verified): 1)  Hydroxyzine Pamoate 50 Mg Caps (Hydroxyzine Pamoate) .... One Capsule By Mouth At Bedtime 2)  Benztropine Mesylate 1 Mg Tabs (Benztropine Mesylate) .... One Tablet By Mouth Two Times A Day 3)  Seroquel 400 Mg Tabs (Quetiapine Fumarate) .Marland Kitchen.. 1 Once Daily 4)  Dilantin 100 Mg Caps (Phenytoin Sodium Extended) .... Four Tabs Once Daily 5)  Hydrocodone-Acetaminophen 5-325 Mg Tabs (Hydrocodone-Acetaminophen) .... Take One Tab For 4 Days  Allergies (verified): 1)  ! * Vitamin C    Current Allergies (reviewed today): ! * VITAMIN C Past History:  Past Surgical History: Last updated: 09/29/2009 Surgery on Head x 3 , surface wounds secondary to trauma  Family History: Last updated: 09/29/2009 No FH of Colon Cancer: Family History of Diabetes: Maternal Uncle  Stomach Cancer: Maternal Cousin  Throat Cancer: Maternal Uncle  Mother alive at age 60, healthy Father deceased, gunshot wound No premature CAD  Social History: Last updated: 11/02/2009 Occupation: Disability Incarcerated 3 times, last release 10/10 Single lives with grandmother No children Patient quit 2009 Alcohol Use - no Illicit Drug Use -marijuana use, never used cocaine, no IVDU  Risk Factors: Smoking Status: never (08/31/2009)  Past Medical History: CHEST PAIN-UNSPECIFIED (ICD-786.50) DEPRESSION (ICD-311) HEADACHE, CHRONIC (ICD-784.0) RECTAL PAIN (NWG-956.21) Delusional parasitosis Gonorrhhea  Social History: Occupation: Disability Incarcerated 3 times, last release 10/10 Single lives with grandmother No children Patient quit 2009 Alcohol Use - no Illicit Drug Use -marijuana use, never used cocaine, no IVDU  Review of Systems  The patient complains of chest pain, dyspnea on exertion, prolonged cough, headaches, abdominal pain, severe indigestion/heartburn, and suspicious skin lesions.  The patient denies  anorexia, fever, weight loss, weight gain, vision loss, decreased hearing, hoarseness, syncope, peripheral edema, hemoptysis, melena, hematochezia, hematuria, incontinence, genital sores, muscle weakness, transient blindness, difficulty walking, depression, unusual weight change, abnormal bleeding, enlarged lymph nodes, angioedema, breast masses, and testicular masses.    Vital Signs:  Patient profile:   45 year old male Height:      71 inches (180.34 cm) Weight:      237 pounds (107.73 kg) BMI:     33.17 Temp:     98.1 degrees F (36.72 degrees C) Pulse rate:   80 / minute BP sitting:   109 / 72  (left arm)  Vitals Entered By: Starleen Arms CMA (November 02, 2009 10:52 AM) CC: PATIENT C/O PARASITES, CAUSING PAIN AND ITCHING AND BURNING Is Patient Diabetic? No Pain Assessment Patient in pain? no      Nutritional Status BMI of > 30 = obese Nutritional Status Detail NL  Have you ever been in a relationship where you felt threatened, hurt or afraid?No   Does patient need assistance? Functional Status Self care Ambulation Normal   Physical Exam  General:  alert, well-developed, well-nourished, and well-hydrated.   Head:  normocephalic, atraumatic, no abnormalities observed, and no abnormalities palpated.   Eyes:  vision grossly intact, pupils equal, pupils round, and pupils reactive to light.  was wearing sun glasses for much of the interview Ears:  no external deformities.   Nose:  no external deformity and no external erythema.   Mouth:  pharynx pink and moist, no erythema, and no exudates.   Neck:  supple and full ROM.   Lungs:  normal respiratory effort, no crackles, and no wheezes.   Heart:  normal rate, regular rhythm, no murmur, no gallop, and no rub.   Abdomen:  soft, non-tender, normal bowel sounds, and no distention.   Rectal:  I found toilet paper strands in his perirectal area but no gross evidence of "worms" Genitalia:  uncircumcised, no testicular masses or  atrophy, and no cutaneous lesions.   Msk:  normal ROM and no joint deformities.   Extremities:  No clubbing, cyanosis, edema, or deformity noted with normal full range of motion of all joints.   Neurologic:  alert & oriented X3, strength normal in all extremities, and sensation intact to light touch.   Skin:  he had few acneiform lesion on his chest and arms, and pedunculated wart on his back Cervical Nodes:  no anterior cervical adenopathy and no posterior cervical adenopathy.   Psych:  Oriented X3, poor eye contact, judgment poor, and delusional.     Impression & Recommendations:  Problem # 1:  PSYCHOSIS (ICD-298.9) Assessment New  THIS IS CLEARLY DELUSIONAL PARASITOSIS. I offered to test him for serology for strongyloides since this is endemic to [place] but more typical in coal miners and in natives of [place] and characterized by local areas or pruritis rather than the generalized symptoms he described. Strongyloides can sometimes be missed if stool is not concentrated. It can be observed by plating stool for bacteria and observing tracks of the pathogens. An upper EGD with push through can also make diagnosis but again I have virtually no suspicion for this. I do worry about this delusional parasitiosis being a manifestation of some undiagnosed underlying medical problem such as HIV, malignancy (though he claims to test negative fof former and  I doubt the latter), more likely it is related to some type of psychological trauma. His relationship with his PCP is the key to his rx, rather than anything I can offer him.   Orders: Consultation Level IV (91478)  Problem # 2:  RECTAL PAIN (ICD-569.42)  No pathology seen on colonoscopy. this is related to his delusional parasitosis.  Orders: Consultation Level IV (29562)  Problem # 3:  CHEST PAIN-UNSPECIFIED (ICD-786.50)  another manifestation of his psychological problem  Orders: Consultation Level IV (13086)  Problem # 4:  SUBSTANCE  ABUSE, MULTIPLE (ICD-305.90)  unlikely to be helping his condition or his risk for future incarceration given his record of incarceration for selling drugs.  Orders: Consultation Level IV (57846)  Problem # 5:  PRURITUS ANI (ICD-698.0) If he ever had pinworm, the multiple rounds of antihelminthics should have long since taken care of this Orders: T-HIV Viral Load ([id number]) T- * Misc. Laboratory test [id number]) T-Comprehensive Metabolic Panel [id number]) T-CBC w/Diff [id number]) T-Hepatitis B Surface Ab, Quant ([id number]) T-Hepatitis C Antibody ([id number]) T-Hepatitis A Antibody ([id number]) Consultation Level IV (10932)  Medications Added to Medication List This Visit: 1)  Seroquel 400 Mg Tabs (Quetiapine fumarate) .Marland Kitchen.. 1 once daily  Patient Instructions: 1)  We are doing some tests today 2)  I will get your Dr.s records. 3)  YOu may schedule a followup appt with Dr. Daiva Eves if your Dr. feels it is indicated and has further conversations with him 4)  Certainly if your lab work today is abnormal we will get back in touch with you promptly for further diagnsosis and treatment

## 2010-09-02 NOTE — Assessment & Plan Note (Signed)
Summary: np6/seen in er/medicaid/jss   Visit Type:  Initial Consult Referring Provider:  n/a Primary Provider:  Reymundo Poll, MD   CC:  chest pain Elsie Saas.  History of Present Illness: 45 yo AAM with history of headaches, depression and per pt report, parasites face and legs who is here today for initial cardiac evaluation secondary to complaint of chest pain and SOB. He has had a recent evaluation in the GI clinic. He has no prior history of cardiac issues, HTN, hyperlipidemia or DM. He describes substernal sharp pain, lasts a few seconds. No radiation of pain or associated SOB, diaphoresis, N/V. He does describe occasional bilateral arm pain. He does not exercise daily. He has occasional dyspnea. Per limited records that are available, he does have a psychiatric history. The more that I question him, the more that it seems that his chest pains similar to the feelings he has in his head, face, arms and legs that he relates to  "worms under his skin".   Problems Prior to Update: 1)  Chest Pain-unspecified  (ICD-786.50) 2)  Depression  (ICD-311) 3)  Headache, Chronic  (ICD-784.0) 4)  Rectal Pain  (GNF-621.30)  Current Medications (verified): 1)  Hydroxyzine Pamoate 50 Mg Caps (Hydroxyzine Pamoate) .... One Capsule By Mouth At Bedtime 2)  Benztropine Mesylate 1 Mg Tabs (Benztropine Mesylate) .... One Tablet By Mouth Two Times A Day 3)  Risperdal (Unknown Dose) 4)  Dilantin 100 Mg Caps (Phenytoin Sodium Extended) .... Four Tabs Once Daily 5)  Hydrocodone-Acetaminophen 5-325 Mg Tabs (Hydrocodone-Acetaminophen) .... Take One Tab For 4 Days  Allergies (verified): No Known Drug Allergies  Past History:  Past Medical History: Reviewed history from 09/21/2009 and no changes required. CHEST PAIN-UNSPECIFIED (ICD-786.50) DEPRESSION (ICD-311) HEADACHE, CHRONIC (ICD-784.0) RECTAL PAIN (QMV-784.69)  Past Surgical History: Surgery on Head x 3 , surface wounds secondary to trauma  Family  History: Reviewed history from 08/31/2009 and no changes required. No FH of Colon Cancer: Family History of Diabetes: Maternal Uncle  Stomach Cancer: Maternal Cousin  Throat Cancer: Maternal Uncle  Mother alive at age 58, healthy Father deceased, gunshot wound No premature CAD  Social History: Reviewed history from 08/31/2009 and no changes required. Occupation: Disability Incarcerated 3 times, last release 10/10 Single No children Patient has never smoked.  Alcohol Use - no Illicit Drug Use -No  Review of Systems       The patient complains of chest pain and shortness of breath.  The patient denies fatigue, malaise, fever, weight gain/loss, vision loss, decreased hearing, hoarseness, palpitations, prolonged cough, wheezing, sleep apnea, coughing up blood, abdominal pain, blood in stool, nausea, vomiting, diarrhea, heartburn, incontinence, blood in urine, muscle weakness, joint pain, leg swelling, rash, skin lesions, headache, fainting, dizziness, depression, anxiety, enlarged lymph nodes, easy bruising or bleeding, and environmental allergies.    Vital Signs:  Patient profile:   45 year old male Height:      71 inches Weight:      242 pounds BMI:     33.87 Pulse rate:   94 / minute BP sitting:   132 / 78  (left arm) Cuff size:   large  Vitals Entered By: Oswald Hillock (September 29, 2009 9:45 AM)  Physical Exam  General:  General: Well developed, well nourished, NAD HEENT: OP clear, mucus membranes moist SKIN: warm, dry Neuro: No focal deficits Musculoskeletal: Muscle strength 5/5 all ext Psychiatric: Mood and affect normal Neck: No JVD, no carotid bruits, no thyromegaly, no lymphadenopathy. Lungs:Clear bilaterally, no wheezes,  rhonci, crackles CV: RRR no murmurs, gallops rubs Abdomen: soft, NT, ND, BS present Extremities: No edema, pulses 2+.    EKG  Procedure date:  09/29/2009  Findings:      NSR rate 84 bpm.   Impression & Recommendations:  Problem  # 1:  CHEST PAIN-UNSPECIFIED (ICD-786.50) His symptoms do not sound cardiac. The chest pain is atypical. EKG normal. It sounds consistent with his delusions of parasites in his entire body. He does describe some dyspnea. Will check echo to rule out structural heart disease. I will call him with the results of the echo.  We will plan to follow up on an as needed basis unless there are abnormalities on the echo.   Orders: EKG w/ Interpretation (93000) Echocardiogram (Echo)  Patient Instructions: 1)  Your physician recommends that you schedule a follow-up appointment as needed 2)  Your physician has requested that you have an echocardiogram.  Echocardiography is a painless test that uses sound waves to create images of your heart. It provides your doctor with information about the size and shape of your heart and how well your heart's chambers and valves are working.  This procedure takes approximately one hour. There are no restrictions for this procedure.

## 2010-09-02 NOTE — Progress Notes (Signed)
Summary: ?parasites  Phone Note Call from Patient   Caller: Patient Summary of Call: Patient called stating that he wanted his test results, I gave them to him and he wanted to know what about his parasites? He states that they are coming out of his stool. I need to speak with Dr. Daiva Eves, so he could advise further. Initial call taken by: Starleen Arms CMA,  November 16, 2009 12:27 PM  Follow-up for Phone Call        This guy DOES NOT HAVE ANY PARASITIC INFECTION. HE SHOULD HAVE NEVER BEEN REFERRED TO ID CLINIC. His stool was checked multiple times and he was tested for parasites in stool by multiple other doctors and treated for nonexistent parasitic infections by them. He has delusional parasitosis. When I tried to test him a few weeks ago I did give in and agree to send one serology off for strongyloides--I dont know if it was ever sent though. If it was not sent we can have him come in for another blood test for strongyloides.I have NO INTEREST IN EXAMINING HIS STOOL FOR OVA AND PARASITES, THAT EXAM HAS BEEN NEGATIVE MULTIPLE, MULTIPLE TIMES AND HIS COLONOSCOPY WITH BIOPSY WAS NORMAL AS WELL! Follow-up by: Acey Lav MD,  November 16, 2009 5:00 PM  Additional Follow-up for Phone Call Additional follow up Details #1::        ok I will let him know. Thanks Additional Follow-up by: Starleen Arms CMA,  November 17, 2009 9:05 AM

## 2010-09-02 NOTE — Progress Notes (Signed)
Summary: results  Phone Note Call from Patient Call back at Home Phone [phone]   Caller: Patient Call For: Kaeden Depaz Reason for Call: Talk to Nurse Summary of Call: Patient would like colon results Initial call taken by: Tawni Levy,  November 19, 2009 4:23 PM  Follow-up for Phone Call        Patient  is still feeling he has worms.  He wants Dr Leone Payor to perform and EGD to look between the layers of his stomach to see the worms.  I explained that this is not possible with an EGD, he wants to come in and discuss this with Dr Leone Payor.  See Dr Reina Fuse office notes and phone note Follow-up by: Darcey Nora RN, CGRN,  November 19, 2009 4:37 PM  Additional Follow-up for Phone Call Additional follow up Details #1::        Needs psychiatry follow-up Additional Follow-up by: Iva Boop MD, Clementeen Graham,  November 19, 2009 4:44 PM

## 2010-09-02 NOTE — Progress Notes (Signed)
Summary: Prep for Procedure  Phone Note Call from Patient Call back at [phone]   Caller: Patient Call For: Dr. Leone Payor Reason for Call: Talk to Nurse Summary of Call: Pt does not have his prep for his procedure please call it in to his pharpacy at [phone] [facility] Initial call taken by: Karna Christmas,  September 02, 2009 2:48 PM  Follow-up for Phone Call        prep sent. Pt notified. Follow-up by: Francee Piccolo CMA Duncan Dull),  September 02, 2009 4:55 PM    New/Updated Medications: MOVIPREP 100 GM  SOLR (PEG-KCL-NACL-NASULF-NA ASC-C) As per prep instructions. Prescriptions: MOVIPREP 100 GM  SOLR (PEG-KCL-NACL-NASULF-NA ASC-C) As per prep instructions.  #1 x 0   Entered by:   Francee Piccolo CMA (AAMA)   Authorized by:   Iva Boop MD, [facility]   Signed by:   Francee Piccolo CMA (AAMA) on 09/02/2009   Method used:   Electronically to        [facility], [facility] (retail)       [address]       [place], [place]  [address]       Ph: [phone]       Fax: [phone]   RxID:   [id number]

## 2010-09-02 NOTE — Progress Notes (Signed)
Summary: TEST RESULTS  Phone Note Call from Patient Call back at Home Phone [phone]   Caller: Patient Reason for Call: Lab or Test Results Initial call taken by: Judie Grieve,  September 22, 2009 10:12 AM  Follow-up for Phone Call        Pt. has questions regarding his colonoscopy results.  Told pt he would need to call GI and I gave him their office number. Follow-up by: Dossie Arbour, RN, BSN,  September 22, 2009 10:25 AM

## 2010-09-02 NOTE — Progress Notes (Signed)
Summary: results ?'s  Phone Note Call from Patient Call back at Home Phone [phone]   Caller: Patient Call For: Dr. Leone Payor Reason for Call: Talk to Nurse Summary of Call: pt has questions about COL results letter Initial call taken by: Vallarie Mare,  September 16, 2009 2:24 PM  Follow-up for Phone Call        Patient  is still feeling the worms in his rectum, chest and thighs.  I explained his colon report.  I have asked the patient to contact his primary care about wormsin his chest and thighs.  I advised him Dr Leone Payor did not see any worms in his rectum or colon. Follow-up by: Darcey Nora RN, CGRN,  September 16, 2009 3:21 PM

## 2010-09-02 NOTE — Miscellaneous (Signed)
Summary: Medication and Allergy update   Clinical Lists Changes  Medications: Added new medication of MEBENDAZOLE 100 MG CHEW (MEBENDAZOLE) Take 1 tablet by mouth two times a day for 5 days per Dr. Linwood Dibbles Observations: Added new observation of NKA: T (10/20/2009 12:08)

## 2010-10-13 LAB — URINE MICROSCOPIC-ADD ON

## 2010-10-13 LAB — URINALYSIS, ROUTINE W REFLEX MICROSCOPIC
Bilirubin Urine: NEGATIVE
Glucose, UA: NEGATIVE mg/dL
Ketones, ur: NEGATIVE mg/dL
Nitrite: NEGATIVE
Protein, ur: NEGATIVE mg/dL
Specific Gravity, Urine: 1.021 (ref 1.005–1.030)
pH: 5.5 (ref 5.0–8.0)

## 2010-10-13 LAB — GC/CHLAMYDIA PROBE AMP, GENITAL: Chlamydia, DNA Probe: NEGATIVE

## 2010-10-16 LAB — DIFFERENTIAL
Basophils Relative: 1 % (ref 0–1)
Lymphs Abs: 1.6 10*3/uL (ref 0.7–4.0)
Monocytes Absolute: 0.7 10*3/uL (ref 0.1–1.0)
Monocytes Relative: 17 % — ABNORMAL HIGH (ref 3–12)
Neutro Abs: 1.6 10*3/uL — ABNORMAL LOW (ref 1.7–7.7)

## 2010-10-16 LAB — CBC
HCT: 37.5 % — ABNORMAL LOW (ref 39.0–52.0)
Hemoglobin: 12.9 g/dL — ABNORMAL LOW (ref 13.0–17.0)
MCH: 30.9 pg (ref 26.0–34.0)
MCHC: 33.1 g/dL (ref 30.0–36.0)
MCV: 90 fL (ref 78.0–100.0)
MCV: 89.5 fL (ref 78.0–100.0)
Platelets: 221 10*3/uL (ref 150–400)
RDW: 15.4 % (ref 11.5–15.5)
RDW: 13.6 % (ref 11.5–15.5)

## 2010-10-16 LAB — BASIC METABOLIC PANEL
Calcium: 9.3 mg/dL (ref 8.4–10.5)
GFR calc Af Amer: >60 mL/min (ref >60)
Glucose, Bld: 130 mg/dL — ABNORMAL HIGH (ref 70–99)
Potassium: 3.4 mEq/L — ABNORMAL LOW (ref 3.5–5.1)

## 2010-10-16 LAB — COMPREHENSIVE METABOLIC PANEL
AST: 38 U/L — ABNORMAL HIGH (ref 0–37)
Albumin: 4.1 g/dL (ref 3.5–5.2)
Alkaline Phosphatase: 91 U/L (ref 39–117)
Calcium: 8.9 mg/dL (ref 8.4–10.5)
GFR calc Af Amer: >60 mL/min (ref >60)
GFR calc non Af Amer: >60 mL/min (ref >60)
Glucose, Bld: 104 mg/dL — ABNORMAL HIGH (ref 70–99)
Potassium: 3.5 mEq/L (ref 3.5–5.1)
Sodium: 136 mEq/L (ref 135–145)

## 2010-10-16 LAB — URINALYSIS, ROUTINE W REFLEX MICROSCOPIC
Bilirubin Urine: NEGATIVE
Glucose, UA: NEGATIVE mg/dL
Hgb urine dipstick: NEGATIVE
Ketones, ur: NEGATIVE mg/dL
Protein, ur: NEGATIVE mg/dL
Specific Gravity, Urine: 1.031 — ABNORMAL HIGH (ref 1.005–1.030)
pH: 5.5 (ref 5.0–8.0)

## 2010-10-16 LAB — RAPID URINE DRUG SCREEN, HOSP PERFORMED: Barbiturates: NOT DETECTED

## 2010-10-16 LAB — HEMOCCULT GUIAC POC 1CARD (OFFICE): Fecal Occult Bld: NEGATIVE

## 2010-10-17 LAB — POCT I-STAT, CHEM 8
BUN: 15 mg/dL (ref 6–23)
Calcium, Ion: 1.15 mmol/L (ref 1.12–1.32)
Chloride: 107 mEq/L (ref 96–112)
Creatinine, Ser: 1.3 mg/dL (ref 0.4–1.5)
Glucose, Bld: 106 mg/dL — ABNORMAL HIGH (ref 70–99)
HCT: 43 % (ref 39.0–52.0)
Hemoglobin: 14.6 g/dL (ref 13.0–17.0)
Potassium: 3.9 mEq/L (ref 3.5–5.1)
Sodium: 141 mEq/L (ref 135–145)
TCO2: 27 mmol/L (ref 0–100)

## 2010-10-17 LAB — URINALYSIS, ROUTINE W REFLEX MICROSCOPIC
Bilirubin Urine: NEGATIVE
Glucose, UA: NEGATIVE mg/dL
Hgb urine dipstick: NEGATIVE
Ketones, ur: NEGATIVE mg/dL
Nitrite: NEGATIVE
Protein, ur: NEGATIVE mg/dL
Specific Gravity, Urine: 1.016 (ref 1.005–1.030)
Urobilinogen, UA: 0.2 mg/dL (ref 0.0–1.0)
pH: 5.5 (ref 5.0–8.0)

## 2010-10-17 LAB — RAPID URINE DRUG SCREEN, HOSP PERFORMED
Amphetamines: NOT DETECTED
Barbiturates: NOT DETECTED
Benzodiazepines: NOT DETECTED
Cocaine: NOT DETECTED
Opiates: NOT DETECTED
Tetrahydrocannabinol: NOT DETECTED

## 2010-10-17 LAB — ETHANOL: Alcohol, Ethyl (B): <5 mg/dL (ref 0–10)

## 2010-10-17 LAB — CBC
HCT: 39.7 % (ref 39.0–52.0)
Hemoglobin: 13.5 g/dL (ref 13.0–17.0)
MCV: 89.6 fL (ref 78.0–100.0)
RDW: 15.2 % (ref 11.5–15.5)
WBC: 8.2 10*3/uL (ref 4.0–10.5)

## 2010-10-17 LAB — DIFFERENTIAL
Basophils Absolute: 0 10*3/uL (ref 0.0–0.1)
Eosinophils Relative: 3 % (ref 0–5)
Lymphocytes Relative: 43 % (ref 12–46)
Lymphs Abs: 3.5 10*3/uL (ref 0.7–4.0)
Monocytes Absolute: 0.8 10*3/uL (ref 0.1–1.0)
Monocytes Relative: 9 % (ref 3–12)
Neutro Abs: 3.6 10*3/uL (ref 1.7–7.7)

## 2010-10-20 LAB — CBC
Hemoglobin: 13.2 g/dL (ref 13.0–17.0)
MCHC: 33.8 g/dL (ref 30.0–36.0)
MCV: 90 fL (ref 78.0–100.0)
RBC: 4.34 MIL/uL (ref 4.22–5.81)

## 2010-10-20 LAB — COMPREHENSIVE METABOLIC PANEL
ALT: 28 U/L (ref 0–53)
CO2: 30 mEq/L (ref 19–32)
Calcium: 9.5 mg/dL (ref 8.4–10.5)
GFR calc non Af Amer: >60 mL/min (ref >60)
Glucose, Bld: 94 mg/dL (ref 70–99)
Sodium: 140 mEq/L (ref 135–145)
Total Bilirubin: 0.3 mg/dL (ref 0.3–1.2)

## 2010-10-20 LAB — DIFFERENTIAL
Basophils Absolute: 0.1 10*3/uL (ref 0.0–0.1)
Basophils Relative: 2 % — ABNORMAL HIGH (ref 0–1)
Eosinophils Absolute: 0.1 10*3/uL (ref 0.0–0.7)
Lymphs Abs: 2.3 10*3/uL (ref 0.7–4.0)
Neutrophils Relative %: 52 % (ref 43–77)

## 2010-10-20 LAB — D-DIMER, QUANTITATIVE: D-Dimer, Quant: 0.42 ug/mL-FEU (ref 0.00–0.48)

## 2010-10-20 LAB — POCT CARDIAC MARKERS: Troponin i, poc: <0.05 ng/mL (ref 0.00–0.09)

## 2010-10-24 LAB — GC/CHLAMYDIA PROBE AMP, GENITAL
Chlamydia, DNA Probe: NEGATIVE
GC Probe Amp, Genital: NEGATIVE

## 2010-11-01 LAB — POCT CARDIAC MARKERS: Myoglobin, poc: 123 ng/mL (ref 12–200)

## 2010-11-02 LAB — RAPID URINE DRUG SCREEN, HOSP PERFORMED
Amphetamines: NOT DETECTED
Barbiturates: NOT DETECTED
Opiates: NOT DETECTED
Tetrahydrocannabinol: POSITIVE — AB

## 2010-11-02 LAB — DIFFERENTIAL
Basophils Relative: 2 % — ABNORMAL HIGH (ref 0–1)
Eosinophils Absolute: 0.4 10*3/uL (ref 0.0–0.7)
Eosinophils Relative: 7 % — ABNORMAL HIGH (ref 0–5)
Monocytes Relative: 10 % (ref 3–12)
Neutrophils Relative %: 36 % — ABNORMAL LOW (ref 43–77)

## 2010-11-02 LAB — BASIC METABOLIC PANEL
BUN: 11 mg/dL (ref 6–23)
CO2: 24 mEq/L (ref 19–32)
Chloride: 103 mEq/L (ref 96–112)
Creatinine, Ser: 1.03 mg/dL (ref 0.4–1.5)
Potassium: 3.9 mEq/L (ref 3.5–5.1)

## 2010-11-02 LAB — CBC
HCT: 36.2 % — ABNORMAL LOW (ref 39.0–52.0)
MCHC: 33.2 g/dL (ref 30.0–36.0)
MCV: 90.2 fL (ref 78.0–100.0)
Platelets: 225 10*3/uL (ref 150–400)

## 2010-11-03 ENCOUNTER — Other Ambulatory Visit: Payer: Self-pay | Admitting: Family Medicine

## 2010-11-03 ENCOUNTER — Ambulatory Visit
Admission: RE | Admit: 2010-11-03 | Discharge: 2010-11-03 | Disposition: A | Discharge status: Home / Self Care | Payer: Medicaid Other | Source: Ambulatory Visit | Attending: Family Medicine | Admitting: Family Medicine

## 2010-11-03 LAB — URINALYSIS, ROUTINE W REFLEX MICROSCOPIC
Ketones, ur: NEGATIVE mg/dL
Ketones, ur: NEGATIVE mg/dL
Nitrite: NEGATIVE
Nitrite: NEGATIVE
Protein, ur: NEGATIVE mg/dL
Protein, ur: NEGATIVE mg/dL
pH: 6 (ref 5.0–8.0)

## 2010-11-03 LAB — DIFFERENTIAL
Eosinophils Absolute: 0.4 10*3/uL (ref 0.0–0.7)
Lymphocytes Relative: 41 % (ref 12–46)
Lymphocytes Relative: 42 % (ref 12–46)
Lymphs Abs: 2.5 10*3/uL (ref 0.7–4.0)
Lymphs Abs: 2.6 10*3/uL (ref 0.7–4.0)
Monocytes Relative: 9 % (ref 3–12)
Neutrophils Relative %: 41 % — ABNORMAL LOW (ref 43–77)

## 2010-11-03 LAB — BASIC METABOLIC PANEL
BUN: 9 mg/dL (ref 6–23)
CO2: 28 mEq/L (ref 19–32)
Creatinine, Ser: 1.02 mg/dL (ref 0.4–1.5)
GFR calc Af Amer: >60 mL/min (ref >60)
GFR calc non Af Amer: >60 mL/min (ref >60)
Glucose, Bld: 102 mg/dL — ABNORMAL HIGH (ref 70–99)
Potassium: 4.4 mEq/L (ref 3.5–5.1)
Potassium: 4.5 mEq/L (ref 3.5–5.1)
Sodium: 138 mEq/L (ref 135–145)

## 2010-11-03 LAB — CBC
HCT: 37.4 % — ABNORMAL LOW (ref 39.0–52.0)
Hemoglobin: 12.6 g/dL — ABNORMAL LOW (ref 13.0–17.0)
MCHC: 33.8 g/dL (ref 30.0–36.0)
Platelets: 228 10*3/uL (ref 150–400)
RBC: 4.09 MIL/uL — ABNORMAL LOW (ref 4.22–5.81)
RDW: 13.4 % (ref 11.5–15.5)
WBC: 6.2 10*3/uL (ref 4.0–10.5)

## 2010-11-03 LAB — URIC ACID: Uric Acid, Serum: 5.6 mg/dL (ref 4.0–7.8)

## 2010-11-03 LAB — RPR: RPR Ser Ql: NONREACTIVE

## 2010-11-03 LAB — GC/CHLAMYDIA PROBE AMP, GENITAL: Chlamydia, DNA Probe: NEGATIVE

## 2010-11-04 ENCOUNTER — Emergency Department (HOSPITAL_COMMUNITY)
Admission: EM | Admit: 2010-11-04 | Discharge: 2010-11-04 | Disposition: A | Discharge status: Home / Self Care | Payer: Medicaid Other | Attending: Emergency Medicine | Admitting: Emergency Medicine

## 2010-11-04 DIAGNOSIS — Z79899 Other long term (current) drug therapy: Secondary | ICD-10-CM | POA: Insufficient documentation

## 2010-11-04 DIAGNOSIS — R369 Urethral discharge, unspecified: Secondary | ICD-10-CM | POA: Insufficient documentation

## 2010-11-04 DIAGNOSIS — N342 Other urethritis: Secondary | ICD-10-CM | POA: Insufficient documentation

## 2010-11-04 DIAGNOSIS — G40909 Epilepsy, unspecified, not intractable, without status epilepticus: Secondary | ICD-10-CM | POA: Insufficient documentation

## 2010-11-04 LAB — COMPREHENSIVE METABOLIC PANEL
Albumin: 4 g/dL (ref 3.5–5.2)
BUN: 13 mg/dL (ref 6–23)
Chloride: 105 mEq/L (ref 96–112)
Creatinine, Ser: 1.1 mg/dL (ref 0.4–1.5)
GFR calc non Af Amer: >60 mL/min (ref >60)
Total Bilirubin: UNDETERMINED mg/dL (ref 0.3–1.2)

## 2010-11-04 LAB — URINALYSIS, ROUTINE W REFLEX MICROSCOPIC
Glucose, UA: NEGATIVE mg/dL
Protein, ur: NEGATIVE mg/dL
Specific Gravity, Urine: 1.022 (ref 1.005–1.030)
pH: 6 (ref 5.0–8.0)

## 2010-11-04 LAB — DIFFERENTIAL
Basophils Absolute: 0.1 10*3/uL (ref 0.0–0.1)
Lymphocytes Relative: 43 % (ref 12–46)
Monocytes Absolute: 0.5 10*3/uL (ref 0.1–1.0)
Neutro Abs: 2.6 10*3/uL (ref 1.7–7.7)

## 2010-11-04 LAB — CBC
HCT: 37.7 % — ABNORMAL LOW (ref 39.0–52.0)
MCHC: 34.4 g/dL (ref 30.0–36.0)
MCV: 89.8 fL (ref 78.0–100.0)
Platelets: 243 10*3/uL (ref 150–400)
WBC: 5.9 10*3/uL (ref 4.0–10.5)

## 2010-11-04 LAB — RAPID URINE DRUG SCREEN, HOSP PERFORMED
Barbiturates: NOT DETECTED
Benzodiazepines: NOT DETECTED
Cocaine: NOT DETECTED
Opiates: NOT DETECTED

## 2010-11-05 LAB — GC/CHLAMYDIA PROBE AMP, GENITAL: GC Probe Amp, Genital: POSITIVE — AB

## 2011-01-01 ENCOUNTER — Emergency Department (HOSPITAL_COMMUNITY)
Admission: EM | Admit: 2011-01-01 | Discharge: 2011-01-01 | Disposition: A | Discharge status: Home / Self Care | Payer: Medicaid Other | Attending: Emergency Medicine | Admitting: Emergency Medicine

## 2011-01-01 ENCOUNTER — Emergency Department (HOSPITAL_COMMUNITY): Payer: Medicaid Other

## 2011-01-01 DIAGNOSIS — R079 Chest pain, unspecified: Secondary | ICD-10-CM | POA: Insufficient documentation

## 2011-01-01 DIAGNOSIS — M545 Low back pain, unspecified: Secondary | ICD-10-CM | POA: Insufficient documentation

## 2011-01-01 DIAGNOSIS — K649 Unspecified hemorrhoids: Secondary | ICD-10-CM | POA: Insufficient documentation

## 2011-01-01 DIAGNOSIS — I10 Essential (primary) hypertension: Secondary | ICD-10-CM | POA: Insufficient documentation

## 2011-01-01 DIAGNOSIS — G40909 Epilepsy, unspecified, not intractable, without status epilepticus: Secondary | ICD-10-CM | POA: Insufficient documentation

## 2011-01-01 DIAGNOSIS — R0602 Shortness of breath: Secondary | ICD-10-CM | POA: Insufficient documentation

## 2011-01-01 DIAGNOSIS — K625 Hemorrhage of anus and rectum: Secondary | ICD-10-CM | POA: Insufficient documentation

## 2011-01-01 LAB — BASIC METABOLIC PANEL
Calcium: 8.9 mg/dL (ref 8.4–10.5)
GFR calc Af Amer: >60 mL/min (ref >60)
GFR calc non Af Amer: >60 mL/min (ref >60)
Sodium: 136 mEq/L (ref 135–145)

## 2011-01-01 LAB — URINALYSIS, ROUTINE W REFLEX MICROSCOPIC
Ketones, ur: NEGATIVE mg/dL
Nitrite: NEGATIVE
Protein, ur: NEGATIVE mg/dL
Urobilinogen, UA: 0.2 mg/dL (ref 0.0–1.0)

## 2011-01-01 LAB — OCCULT BLOOD, POC DEVICE: Fecal Occult Bld: POSITIVE

## 2011-01-01 LAB — DIFFERENTIAL
Basophils Absolute: 0 10*3/uL (ref 0.0–0.1)
Eosinophils Absolute: 0.3 10*3/uL (ref 0.0–0.7)
Eosinophils Relative: 5 % (ref 0–5)

## 2011-01-01 LAB — CBC
Hemoglobin: 12.5 g/dL — ABNORMAL LOW (ref 13.0–17.0)
MCHC: 33.6 g/dL (ref 30.0–36.0)
Platelets: 237 10*3/uL (ref 150–400)
RDW: 14.6 % (ref 11.5–15.5)

## 2011-02-01 ENCOUNTER — Encounter (HOSPITAL_COMMUNITY): Payer: Self-pay | Admitting: Radiology

## 2011-02-01 ENCOUNTER — Emergency Department (HOSPITAL_COMMUNITY): Payer: Medicaid Other

## 2011-02-01 ENCOUNTER — Emergency Department (HOSPITAL_COMMUNITY)
Admission: EM | Admit: 2011-02-01 | Discharge: 2011-02-01 | Disposition: A | Discharge status: Home / Self Care | Payer: Medicaid Other | Attending: Emergency Medicine | Admitting: Emergency Medicine

## 2011-02-01 DIAGNOSIS — R0602 Shortness of breath: Secondary | ICD-10-CM | POA: Insufficient documentation

## 2011-02-01 DIAGNOSIS — S335XXA Sprain of ligaments of lumbar spine, initial encounter: Secondary | ICD-10-CM | POA: Insufficient documentation

## 2011-02-01 DIAGNOSIS — M5380 Other specified dorsopathies, site unspecified: Secondary | ICD-10-CM | POA: Insufficient documentation

## 2011-02-01 DIAGNOSIS — Z79899 Other long term (current) drug therapy: Secondary | ICD-10-CM | POA: Insufficient documentation

## 2011-02-01 DIAGNOSIS — G40909 Epilepsy, unspecified, not intractable, without status epilepticus: Secondary | ICD-10-CM | POA: Insufficient documentation

## 2011-02-01 DIAGNOSIS — X58XXXA Exposure to other specified factors, initial encounter: Secondary | ICD-10-CM | POA: Insufficient documentation

## 2011-02-01 DIAGNOSIS — R071 Chest pain on breathing: Secondary | ICD-10-CM | POA: Insufficient documentation

## 2011-02-01 DIAGNOSIS — R079 Chest pain, unspecified: Secondary | ICD-10-CM | POA: Insufficient documentation

## 2011-02-01 LAB — DIFFERENTIAL
Basophils Absolute: 0 10*3/uL (ref 0.0–0.1)
Lymphocytes Relative: 40 % (ref 12–46)
Neutro Abs: 3.5 10*3/uL (ref 1.7–7.7)
Neutrophils Relative %: 49 % (ref 43–77)

## 2011-02-01 LAB — CBC
HCT: 36.5 % — ABNORMAL LOW (ref 39.0–52.0)
Hemoglobin: 12.2 g/dL — ABNORMAL LOW (ref 13.0–17.0)
MCH: 29.9 pg (ref 26.0–34.0)
MCHC: 33.4 g/dL (ref 30.0–36.0)
Platelets: 227 10*3/uL (ref 150–400)
RDW: 13.9 % (ref 11.5–15.5)
WBC: 7.1 10*3/uL (ref 4.0–10.5)

## 2011-02-01 LAB — BASIC METABOLIC PANEL
BUN: 11 mg/dL (ref 6–23)
Calcium: 8.8 mg/dL (ref 8.4–10.5)
Chloride: 101 mEq/L (ref 96–112)
Creatinine, Ser: 1.02 mg/dL (ref 0.50–1.35)
GFR calc Af Amer: >60 mL/min (ref >60)

## 2011-02-01 LAB — TROPONIN I: Troponin I: <0.3 ng/mL (ref <0.30)

## 2011-02-01 MED ORDER — IOHEXOL 300 MG/ML  SOLN
100.0000 mL | Freq: Once | INTRAMUSCULAR | Status: AC | PRN
  Administered 2011-02-01: 100 mL via INTRAVENOUS

## 2011-03-08 ENCOUNTER — Other Ambulatory Visit: Payer: Self-pay | Admitting: Family Medicine

## 2011-03-08 DIAGNOSIS — M545 Low back pain: Secondary | ICD-10-CM

## 2011-03-09 ENCOUNTER — Other Ambulatory Visit: Payer: Self-pay | Admitting: Family Medicine

## 2011-03-09 DIAGNOSIS — M545 Low back pain: Secondary | ICD-10-CM

## 2011-03-16 ENCOUNTER — Other Ambulatory Visit: Payer: Medicaid Other

## 2011-03-16 ENCOUNTER — Ambulatory Visit
Admission: RE | Admit: 2011-03-16 | Discharge: 2011-03-16 | Disposition: A | Discharge status: Home / Self Care | Payer: Medicaid Other | Source: Ambulatory Visit | Attending: Family Medicine | Admitting: Family Medicine

## 2011-03-16 ENCOUNTER — Other Ambulatory Visit: Payer: Self-pay | Admitting: Family Medicine

## 2011-03-16 DIAGNOSIS — M545 Low back pain: Secondary | ICD-10-CM

## 2011-03-18 ENCOUNTER — Ambulatory Visit
Admission: RE | Admit: 2011-03-18 | Discharge: 2011-03-18 | Disposition: A | Discharge status: Home / Self Care | Payer: Medicaid Other | Source: Ambulatory Visit | Attending: Family Medicine | Admitting: Family Medicine

## 2011-03-18 DIAGNOSIS — M545 Low back pain: Secondary | ICD-10-CM

## 2011-03-29 ENCOUNTER — Emergency Department (HOSPITAL_COMMUNITY)
Admission: EM | Admit: 2011-03-29 | Discharge: 2011-03-29 | Disposition: A | Discharge status: Home / Self Care | Payer: Medicaid Other | Attending: Emergency Medicine | Admitting: Emergency Medicine

## 2011-03-29 DIAGNOSIS — G40909 Epilepsy, unspecified, not intractable, without status epilepticus: Secondary | ICD-10-CM | POA: Insufficient documentation

## 2011-03-29 DIAGNOSIS — Z79899 Other long term (current) drug therapy: Secondary | ICD-10-CM | POA: Insufficient documentation

## 2011-03-29 DIAGNOSIS — M549 Dorsalgia, unspecified: Secondary | ICD-10-CM | POA: Insufficient documentation

## 2011-03-29 DIAGNOSIS — F319 Bipolar disorder, unspecified: Secondary | ICD-10-CM | POA: Insufficient documentation

## 2011-04-29 ENCOUNTER — Emergency Department (HOSPITAL_COMMUNITY): Payer: Medicaid Other

## 2011-04-29 ENCOUNTER — Emergency Department (HOSPITAL_COMMUNITY)
Admission: EM | Admit: 2011-04-29 | Discharge: 2011-04-29 | Disposition: A | Discharge status: Home / Self Care | Payer: Medicaid Other | Attending: Emergency Medicine | Admitting: Emergency Medicine

## 2011-04-29 DIAGNOSIS — M25529 Pain in unspecified elbow: Secondary | ICD-10-CM | POA: Insufficient documentation

## 2011-04-29 DIAGNOSIS — IMO0002 Reserved for concepts with insufficient information to code with codable children: Secondary | ICD-10-CM | POA: Insufficient documentation

## 2011-04-29 DIAGNOSIS — R569 Unspecified convulsions: Secondary | ICD-10-CM | POA: Insufficient documentation

## 2011-04-29 DIAGNOSIS — S42409A Unspecified fracture of lower end of unspecified humerus, initial encounter for closed fracture: Secondary | ICD-10-CM | POA: Insufficient documentation

## 2011-04-29 DIAGNOSIS — F319 Bipolar disorder, unspecified: Secondary | ICD-10-CM | POA: Insufficient documentation

## 2011-04-29 DIAGNOSIS — E7800 Pure hypercholesterolemia, unspecified: Secondary | ICD-10-CM | POA: Insufficient documentation

## 2011-04-29 DIAGNOSIS — M25519 Pain in unspecified shoulder: Secondary | ICD-10-CM | POA: Insufficient documentation

## 2011-05-04 ENCOUNTER — Inpatient Hospital Stay (INDEPENDENT_AMBULATORY_CARE_PROVIDER_SITE_OTHER)
Admission: RE | Admit: 2011-05-04 | Discharge: 2011-05-04 | Disposition: A | Discharge status: Home / Self Care | Payer: Medicaid Other | Source: Ambulatory Visit | Attending: Family Medicine | Admitting: Family Medicine

## 2011-05-04 DIAGNOSIS — S42409A Unspecified fracture of lower end of unspecified humerus, initial encounter for closed fracture: Secondary | ICD-10-CM

## 2011-05-04 DIAGNOSIS — IMO0002 Reserved for concepts with insufficient information to code with codable children: Secondary | ICD-10-CM

## 2011-05-05 ENCOUNTER — Emergency Department (HOSPITAL_COMMUNITY)
Admission: EM | Admit: 2011-05-05 | Discharge: 2011-05-05 | Discharge status: Against Medical Advice | Payer: Medicaid Other | Source: Home / Self Care | Attending: Emergency Medicine | Admitting: Emergency Medicine

## 2011-05-05 ENCOUNTER — Emergency Department (HOSPITAL_COMMUNITY)
Admission: EM | Admit: 2011-05-05 | Discharge: 2011-05-05 | Disposition: A | Discharge status: Other Acute Inpatient Hospital | Payer: Medicaid Other | Attending: Emergency Medicine | Admitting: Emergency Medicine

## 2011-05-05 DIAGNOSIS — Z0389 Encounter for observation for other suspected diseases and conditions ruled out: Secondary | ICD-10-CM | POA: Insufficient documentation

## 2011-05-05 DIAGNOSIS — Z4800 Encounter for change or removal of nonsurgical wound dressing: Secondary | ICD-10-CM | POA: Insufficient documentation

## 2011-05-09 ENCOUNTER — Emergency Department (HOSPITAL_COMMUNITY): Payer: Medicaid Other

## 2011-05-09 ENCOUNTER — Emergency Department (HOSPITAL_COMMUNITY)
Admission: EM | Admit: 2011-05-09 | Discharge: 2011-05-09 | Disposition: A | Discharge status: Home / Self Care | Payer: Medicaid Other | Attending: Emergency Medicine | Admitting: Emergency Medicine

## 2011-05-09 DIAGNOSIS — R569 Unspecified convulsions: Secondary | ICD-10-CM | POA: Insufficient documentation

## 2011-05-09 DIAGNOSIS — M25529 Pain in unspecified elbow: Secondary | ICD-10-CM | POA: Insufficient documentation

## 2011-05-09 DIAGNOSIS — F319 Bipolar disorder, unspecified: Secondary | ICD-10-CM | POA: Insufficient documentation

## 2011-05-09 DIAGNOSIS — M25569 Pain in unspecified knee: Secondary | ICD-10-CM | POA: Insufficient documentation

## 2011-05-09 DIAGNOSIS — M79609 Pain in unspecified limb: Secondary | ICD-10-CM | POA: Insufficient documentation

## 2011-05-09 DIAGNOSIS — E7800 Pure hypercholesterolemia, unspecified: Secondary | ICD-10-CM | POA: Insufficient documentation

## 2011-05-16 LAB — GC/CHLAMYDIA PROBE AMP, GENITAL: GC Probe Amp, Genital: POSITIVE — AB

## 2011-10-03 IMAGING — CT CT ANGIO CHEST
2 of 6 series · 19 of 46 positions shown · IV contrast (APPLIED)
Comparison: Chest radiographs dated 02/01/2011.

CLINICAL DATA: Shortness of breath x1 week, mid chest pain,
evaluate for PE

CT ANGIOGRAPHY CHEST WITH CONTRAST
TECHNIQUE: Multidetector CT imaging of the chest was performed
using the standard protocol during bolus administration of
intravenous contrast.  Multiplanar CT image reconstructions
including MIPs were obtained to evaluate the vascular anatomy.
Contrast:  100 ml Emnipaque-T55 IV

[Series 5: pe 1.0 b20f st · axial · 0.67mm/px · z∈[-133,+127]mm · 16 of 286 slices shown]
[im 13/286  lung]
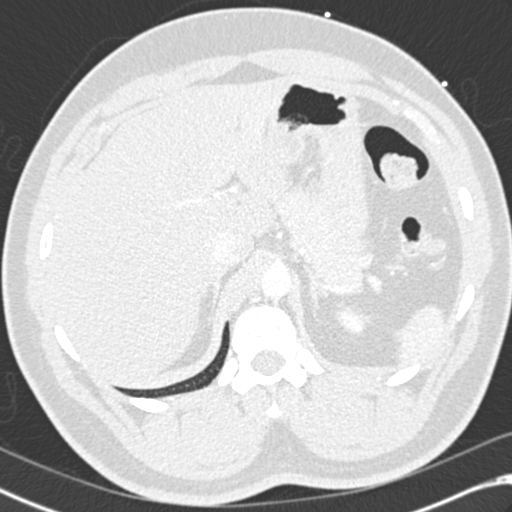
[im 38/286  soft-tissue]
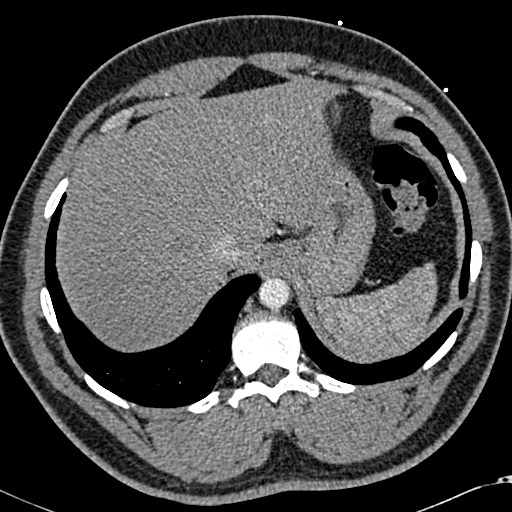
[im 50/286  lung]
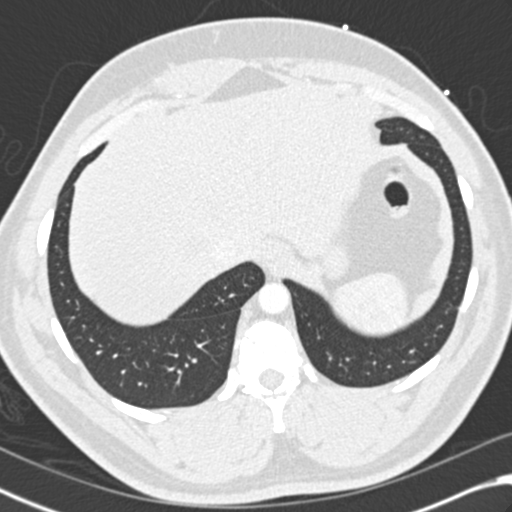
[im 62/286  soft-tissue]
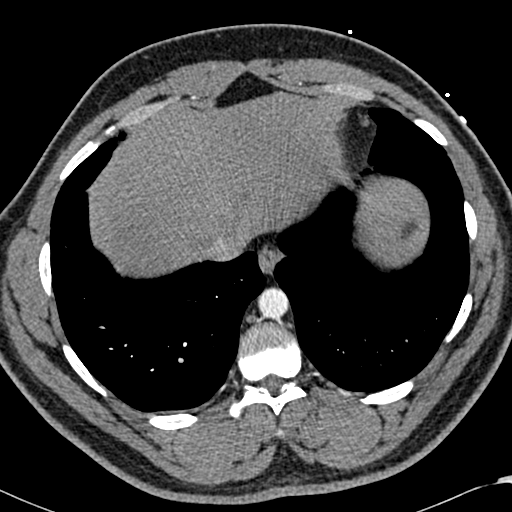
[im 87/286  lung]
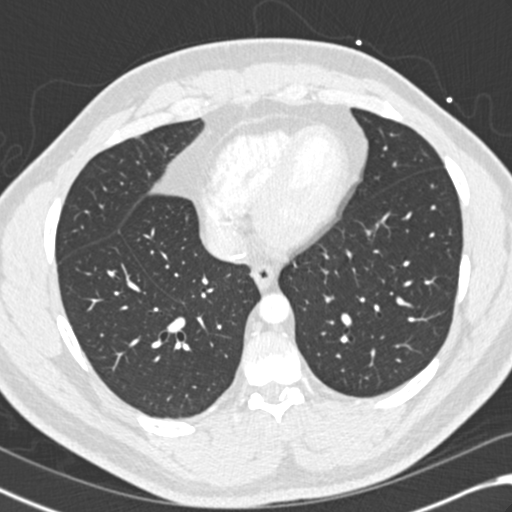
[im 100/286  soft-tissue]
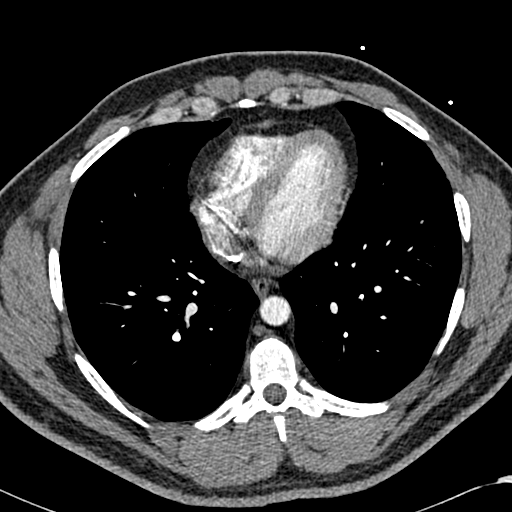
[im 112/286  lung]
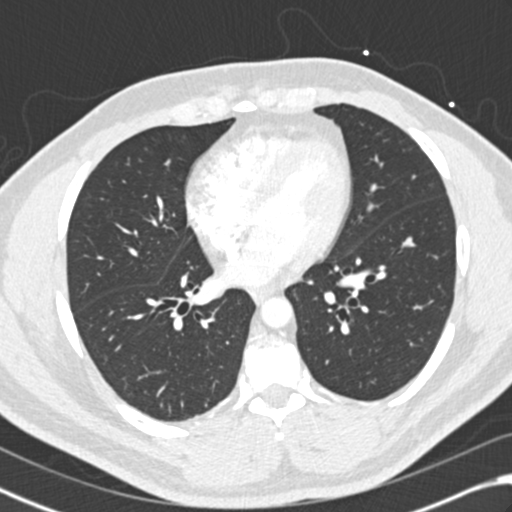
[im 137/286  soft-tissue]
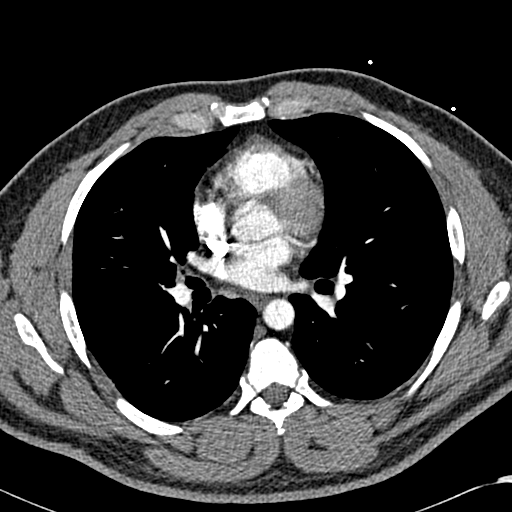
[im 149/286  lung]
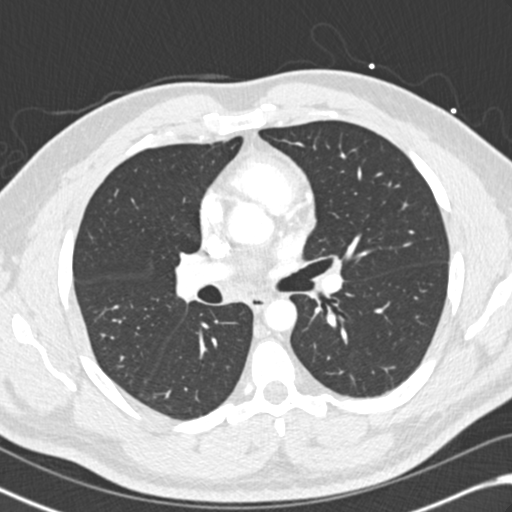
[im 174/286  soft-tissue]
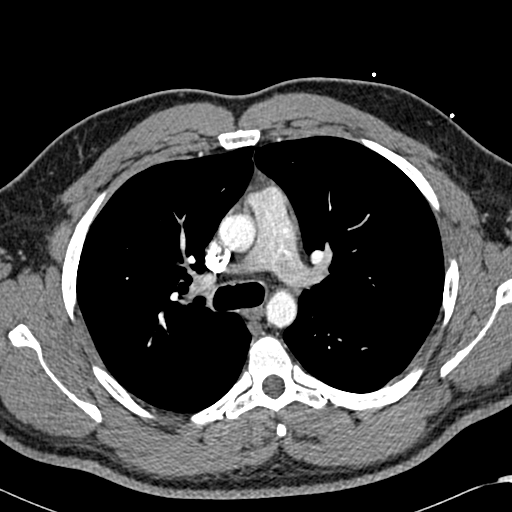
[im 186/286  lung]
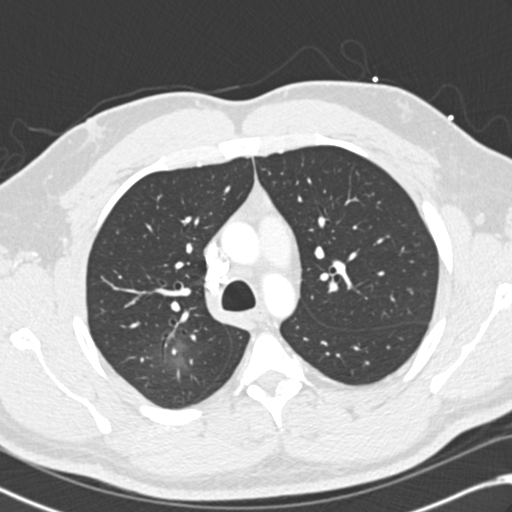
[im 199/286  soft-tissue]
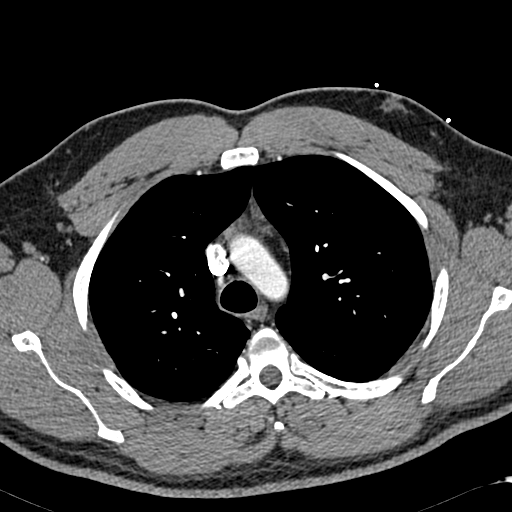
[im 224/286  lung]
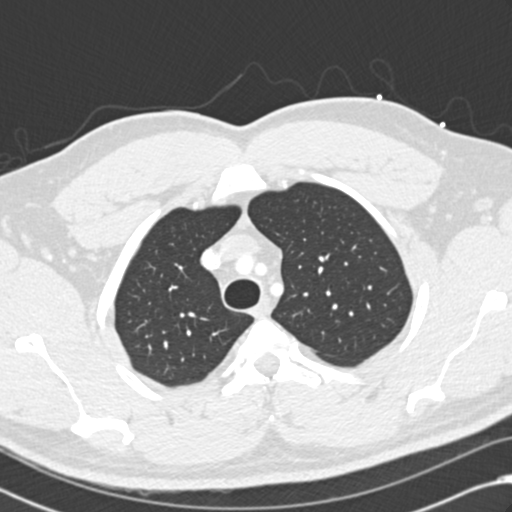
[im 236/286  soft-tissue]
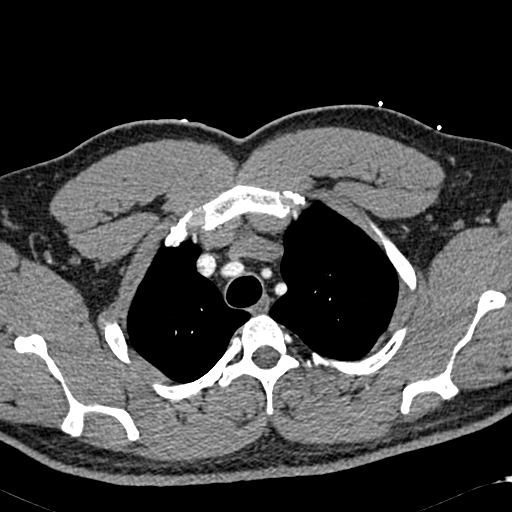
[im 248/286  lung]
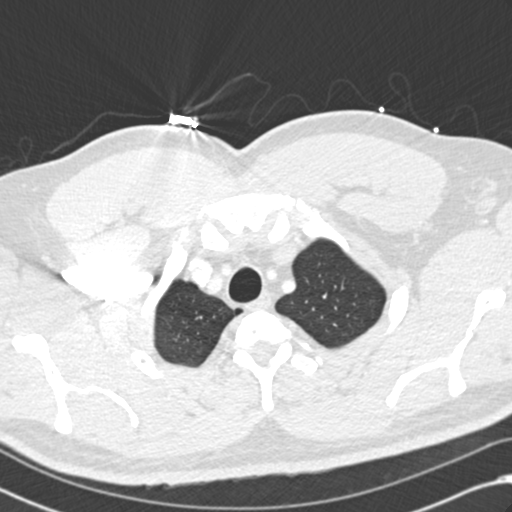
[im 273/286  soft-tissue]
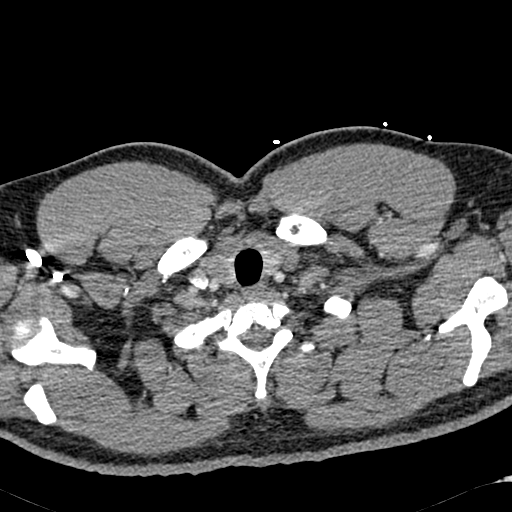

[Series 602: coronal mpr · coronal · 0.67mm/px · 3 of 140 slices shown]
[im 35/140  soft-tissue]
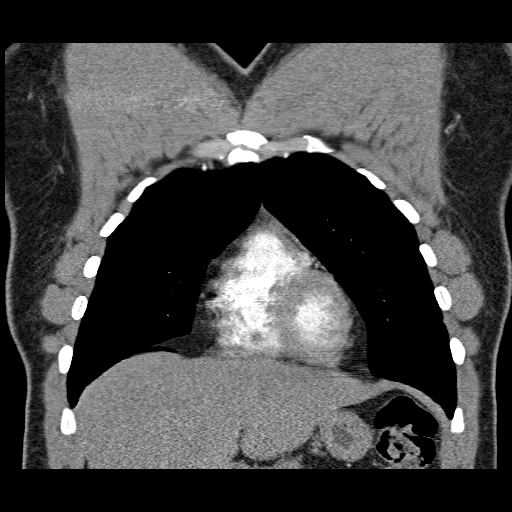
[im 70/140  soft-tissue]
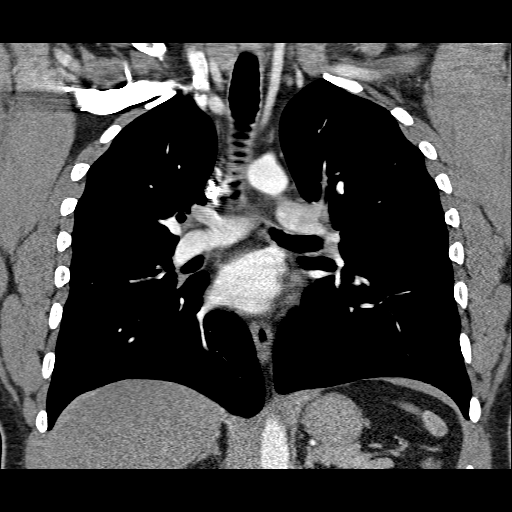
[im 105/140  soft-tissue]
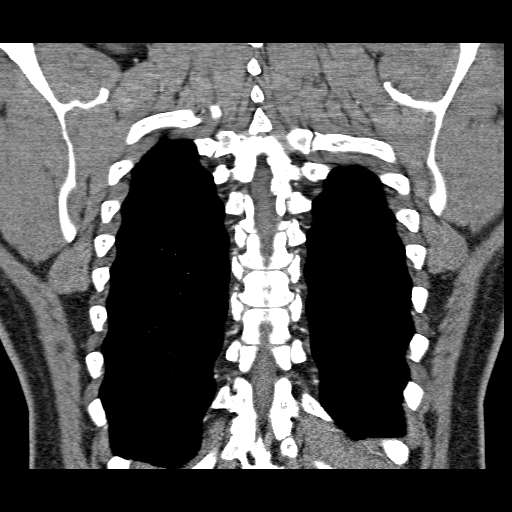

[19 of 46 positions shown; findings below may reference images not displayed]

FINDINGS: No evidence of pulmonary embolus.

Very mild ground-glass opacity in the posterior segment right upper
lobe (series 6/images 30-40), possibly infectious.  No suspicious
pulmonary nodules. No pleural effusion or pneumothorax.

Visualized right thyroid lobe is mildly heterogeneous.

The heart is normal in size.  No pericardial effusion.  Coronary
atherosclerosis.

No suspicious mediastinal, hilar, or axillary lymphadenopathy.  The

Visualized upper abdomen is grossly unremarkable noting a
hypoenhancing lesion in the medial segment left hepatic lobe
(series 4/image 84), incompletely characterized, statistically
likely benign.

Review of the MIP images confirms the above findings.
IMPRESSION: No evidence of pulmonary embolus.

Very mild ground-glass opacity in the right upper lobe, possibly
infectious. No suspicious pulmonary nodules.

2.0 cm hypoenhancing lesion in the left hepatic lobe, incompletely
characterized, statistically likely benign.

## 2011-12-21 ENCOUNTER — Encounter: Payer: Self-pay | Admitting: Internal Medicine

## 2011-12-21 ENCOUNTER — Ambulatory Visit (INDEPENDENT_AMBULATORY_CARE_PROVIDER_SITE_OTHER): Payer: Medicaid Other | Admitting: Internal Medicine

## 2011-12-21 VITALS — BP 110/78 | HR 130 | Ht 73.0 in | Wt 257.8 lb

## 2011-12-21 DIAGNOSIS — F22 Delusional disorders: Secondary | ICD-10-CM

## 2011-12-21 DIAGNOSIS — R202 Paresthesia of skin: Secondary | ICD-10-CM

## 2011-12-21 DIAGNOSIS — R209 Unspecified disturbances of skin sensation: Secondary | ICD-10-CM

## 2011-12-21 DIAGNOSIS — G40909 Epilepsy, unspecified, not intractable, without status epilepticus: Secondary | ICD-10-CM | POA: Insufficient documentation

## 2011-12-21 DIAGNOSIS — F40298 Other specified phobia: Secondary | ICD-10-CM

## 2011-12-21 HISTORY — DX: Delusional disorders: F22

## 2011-12-21 NOTE — Assessment & Plan Note (Signed)
Chronic recurrent problem. 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 He is not on these. I will attempt again to reach his psychiatric provider if he can tell as who that is to see if he cannot get on some different medication for treatment. I explained to him in a non-confrontational way that I believe his symptoms are revealed that however without actually observing and finding parasites there is no specific treatment for those and to date through multiple tests we have not found them. He was not particularly happy about this but handled it in a reasonable manner overall. I did not promise therapy for parasites but therapy for his symptoms.

## 2011-12-21 NOTE — Patient Instructions (Signed)
Please call us back with the Doctor's name and phone number on your medicine bottles.  Our number is [phone]. My name is PJ.

## 2011-12-21 NOTE — Progress Notes (Signed)
  Subjective:    Patient ID: Jeremiah Mcguire, male    DOB: 1966-07-30, 46 y.o.   MRN: [id number]  HPI The patient presents complaining of rectal pain and bleeding and some abdominal pain. These are chronic recurrent symptoms that have not been substantiated in the past with colonoscopy and other imaging studies. As we discuss things further he is talking about worms crawling on his body and in his body again. This is been a chronic recurrent issue with him. He has delusional parasitosis. Today he tells me that if he sits on his back massager, after a few minutes he will feel things wrinkling and crawling in his buttocks are on his buttocks and worms will come out of his rectum.  Medications, allergies, past medical history, past surgical history, family history and social history are reviewed and updated in the EMR.   Review of Systems As above    Objective:   Physical Exam Well-developed well-nourished large black male in no acute distress Inspection of the buttocks and rectal area shows no evidence of worms or headaches. There is no perianal skin change. There are no rashes in this area. I inspected these areas after the patient sat on his back massager for several minutes, which indicate always produces the parasites. I took a photo of the gluteal and buttock area and showed it to him.       Assessment & Plan:   1. Ekbom's delusional parasitosis   2. Paresthesias    I will see if we can get him psychiatric help. He could not give me the name of his psychiatric provider or prescriber. He is supposed to call back with the information.

## 2012-12-08 ENCOUNTER — Encounter (HOSPITAL_COMMUNITY): Payer: Self-pay | Admitting: *Deleted

## 2012-12-08 ENCOUNTER — Emergency Department (HOSPITAL_COMMUNITY)
Admission: EM | Admit: 2012-12-08 | Discharge: 2012-12-08 | Disposition: A | Discharge status: Home / Self Care | Payer: Medicaid Other | Attending: Emergency Medicine | Admitting: Emergency Medicine

## 2012-12-08 DIAGNOSIS — F3289 Other specified depressive episodes: Secondary | ICD-10-CM | POA: Insufficient documentation

## 2012-12-08 DIAGNOSIS — Z8619 Personal history of other infectious and parasitic diseases: Secondary | ICD-10-CM | POA: Insufficient documentation

## 2012-12-08 DIAGNOSIS — N489 Disorder of penis, unspecified: Secondary | ICD-10-CM | POA: Insufficient documentation

## 2012-12-08 DIAGNOSIS — Z8659 Personal history of other mental and behavioral disorders: Secondary | ICD-10-CM | POA: Insufficient documentation

## 2012-12-08 DIAGNOSIS — Z860100 Personal history of colon polyps, unspecified: Secondary | ICD-10-CM | POA: Insufficient documentation

## 2012-12-08 DIAGNOSIS — N481 Balanitis: Secondary | ICD-10-CM

## 2012-12-08 DIAGNOSIS — Z79899 Other long term (current) drug therapy: Secondary | ICD-10-CM | POA: Insufficient documentation

## 2012-12-08 DIAGNOSIS — Z8669 Personal history of other diseases of the nervous system and sense organs: Secondary | ICD-10-CM | POA: Insufficient documentation

## 2012-12-08 DIAGNOSIS — N476 Balanoposthitis: Secondary | ICD-10-CM | POA: Insufficient documentation

## 2012-12-08 DIAGNOSIS — Z85038 Personal history of other malignant neoplasm of large intestine: Secondary | ICD-10-CM | POA: Insufficient documentation

## 2012-12-08 DIAGNOSIS — F329 Major depressive disorder, single episode, unspecified: Secondary | ICD-10-CM | POA: Insufficient documentation

## 2012-12-08 DIAGNOSIS — G40909 Epilepsy, unspecified, not intractable, without status epilepticus: Secondary | ICD-10-CM | POA: Insufficient documentation

## 2012-12-08 MED ORDER — CLOTRIMAZOLE 1 % EX CREA
TOPICAL_CREAM | Freq: Two times a day (BID) | CUTANEOUS | Status: DC
  Filled 2012-12-08: qty 15

## 2012-12-08 NOTE — ED Provider Notes (Signed)
History     CSN: [id number]  Arrival date & time 12/08/12  1478   First MD Initiated Contact with Patient 12/08/12 0745      Chief Complaint  Patient presents with  . SEXUALLY TRANSMITTED DISEASE    (Consider location/radiation/quality/duration/timing/severity/associated sxs/prior treatment) HPI Comments: Patient with past medical history including gonorrhea reports that he's had a lesion to his foreskin and mild amount of discomfort towards the head of his penis after having unprotected sexual intercourse with a "girl" about one week ago. He denies history of diabetes. He denies fever or chills. He denies any burning with urination, testicular pain or swelling. He denies any abdominal pain, diarrhea, vomiting. No medications taken for this specifically prior to arrival.  The history is provided by the patient.    Past Medical History  Diagnosis Date  . Psychosis     Questioned schizophrenia  . Substance abuse   . Depression   . Chronic headache   . Seizure disorder   . Colon polyp, hyperplastic   . 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 Gonorrhea   . Seizure disorder     Past Surgical History  Procedure Laterality Date  . Skull fracture elevation    . Colonoscopy  09/04/2009    Dr. Stan Head    Family History  Problem Relation Age of Onset  . Diabetes Mother     History  Substance Use Topics  . Smoking status: Never Smoker   . Smokeless tobacco: Never Used  . Alcohol Use: No      Review of Systems  Constitutional: Negative for fever and chills.  Gastrointestinal: Negative for nausea, vomiting, abdominal pain and blood in stool.  Genitourinary: Positive for penile pain. Negative for dysuria, urgency, flank pain, penile swelling, scrotal swelling, difficulty urinating and testicular pain.    Allergies  Review of patient's allergies indicates no known allergies.  Home Medications   Current Outpatient Rx  Name  Route  Sig  Dispense  Refill  .  benztropine (COGENTIN) 1 MG tablet   Oral   Take 1 mg by mouth 2 (two) times daily.           . phenytoin (DILANTIN) 100 MG ER capsule   Oral   Take 400 mg by mouth daily.           . QUEtiapine (SEROQUEL) 400 MG tablet   Oral   Take 400 mg by mouth at bedtime.             BP 138/78  Pulse 88  Temp(Src) 97.5 F (36.4 C) (Oral)  Resp 16  SpO2 98%  Physical Exam  Nursing note and vitals reviewed. Constitutional: He appears well-developed and well-nourished. No distress.  HENT:  Head: Normocephalic and atraumatic.  Pulmonary/Chest: Effort normal.  Abdominal: Soft. There is no tenderness.  Genitourinary: Testes normal. Uncircumcised. Penile tenderness present. No phimosis or paraphimosis. No discharge found.  Excoriation around the head of his penis and distal foreskin, mild erythema, wet appearing. Minimal to mild tenderness.  Neurological: He is alert. Coordination normal.  Skin: Skin is warm and dry. He is not diaphoretic.  Psychiatric: He has a normal mood and affect.    ED Course  Procedures (including critical care time)  Labs Reviewed - No data to display No results found.   1. Balanitis    Room air saturation is 98% I interpret this to be normal   MDM   Appearance a skin rash that has penis and underneath distal foreskin  is consistent with balanitis. Topical  clotrimazole is ordered and patient told to take twice a day, to maintain cleanliness and he can followup with [facility] health Department as needed.        Gavin Pound. Oletta Lamas, MD 12/08/12 4098

## 2012-12-08 NOTE — Discharge Instructions (Signed)
Balanitis Balanitis is an common infection of the head (glans) of the penis. CAUSES  Balanitis has multiple causes. Frequently balanitis is the result of poor personal hygiene. Especially if no circumcision has been done. Without adequate washing, many different kinds of germs (viruses, bacteria, and yeast) collect between the foreskin and the glans. This can cause an infection. Lack of air and irritation from a normal secretion called smegma contribute to the cause in uncircumcised males. Other causes include chemical irritation by certain soaps (especially soaps with perfumes). When no circumcision has been done, a frequent cause of poor hygiene is that the tip of the foreskin is tight (phimosis) and cannot be pulled back for adequate washing. Illnesses in other areas of the body can also cause balanitis. This includes illnesses that cause water retention and swelling, such as:  Heart failure.  Cirrhosis of the liver.  Kidney problems. Other contributing causes include:  Obesity.  Certain allergies to drugs such as tetracycline and sulfa.  Diabetes. SYMPTOMS  Symptoms may include:  Discharge coming from under the foreskin.  Tenderness.  Itching and inability to get an erection (because of the pain).  Redness and a rash is frequently seen.  If the problem remains for a while, sores can be seen on the glans and on the foreskin. If the condition is not treated other complications such as a scar of the opening to the urethra (tube that carries the urine out from the bladder) can occur and block the bladder. This narrowing is called meatal stenosis. Other problems can occur such as:  Infection of the lymph nodes in the crease of the groin.  Ballooning of the foreskin when voiding (when the foreskin opening has scarred down and been made smaller).  Blockage of the bladder.  Frequent urinary infections occur in children with balanitis. HOME CARE INSTRUCTIONS   Pull back foreskin  to urinate and when washing.  Pull back foreskin when putting medication on the affected area to prevent the foreskin from swelling and being trapped behind the head.  Keep foreskin and glans clean and dry.  Sitz baths may be helpful.  Take your medication as directed .  Pain medication, if needed.  Circumcision (may be recommended). SEEK IMMEDIATE MEDICAL CARE IF:   The affected area becomes trapped behind the head.  You start a fever.  The swelling increases. Document Released: 12/04/2008 Document Revised: 10/10/2011 Document Reviewed: 12/04/2008 Exit[employer] Patient Information 2013 [employer].  

## 2012-12-08 NOTE — ED Notes (Signed)
Pt given cream to take home and apply himself

## 2012-12-08 NOTE — ED Notes (Signed)
Pt wants to be checked for std

## 2013-08-30 ENCOUNTER — Ambulatory Visit (INDEPENDENT_AMBULATORY_CARE_PROVIDER_SITE_OTHER): Payer: Medicaid Other | Admitting: Internal Medicine

## 2013-08-30 ENCOUNTER — Encounter: Payer: Self-pay | Admitting: Internal Medicine

## 2013-08-30 VITALS — BP 108/64 | HR 92 | Ht 71.25 in | Wt 228.2 lb

## 2013-08-30 DIAGNOSIS — F22 Delusional disorders: Secondary | ICD-10-CM

## 2013-08-30 DIAGNOSIS — K648 Other hemorrhoids: Secondary | ICD-10-CM | POA: Insufficient documentation

## 2013-08-30 DIAGNOSIS — F40298 Other specified phobia: Secondary | ICD-10-CM

## 2013-08-30 MED ORDER — HYDROCORTISONE ACETATE 25 MG RE SUPP: 25.0000 mg | Freq: Every evening | RECTAL | Status: DC | PRN

## 2013-08-30 NOTE — Progress Notes (Signed)
         Subjective:    Patient ID: Jeremiah Mcguire, male    DOB: [birth year], 48 y.o.   MRN: [id number]  HPI The patient presents for followup. I know him from previous encounters for delusional parasitosis. He still believes he has worms, he feels itching in the anal area and says he sees white worms when he wipes.  Today his chief complaint is around rectal bleeding, on a few occasions she has seen bright red blood on the tissue paper or streaks into the commode. He did have a colonoscopy with some diminutive left-sided hyperplastic polyps in 2011.  Medications, allergies, past medical history, past surgical history, family history and social history are reviewed and updated in the EMR.   Review of Systems  No other complaints today    Objective:   Physical Exam No acute distress, middle-aged black man appears well  Rectal exam:  Male staff present for exam and anoscopy Anoderm inspection revealed a tiny posterior tag Digital exam revealed normal resting tone, brown stool and no mass present. Anoscopy exam demonstrates inflamed internal hemorrhoids in all 3 positions left lateral most prominent, grade 1      Assessment & Plan:    Hemorrhoids, internal, with bleeding - Plan: hydrocortisone (ANUSOL-HC) 25 MG suppository  Ekbom's delusional parasitosis  Cc: York Ram, MD

## 2013-08-30 NOTE — Assessment & Plan Note (Signed)
He does have symptomatic hemorrhoids. This could be related to his perceived White worms i.e. I wonder if he has some sort of mucoid discharge from these at times. I'm going to treat with hydrocortisone suppositories at this time. Hemorrhoidal banding is an option but given his overall set of issues particularly with chronic anorectal symptoms and delusional parasitosis I am reluctant to do this. He will followup as needed.

## 2013-08-30 NOTE — Patient Instructions (Signed)
We have sent the following medications to your pharmacy for you to pick up at your convenience: Anusol suppositories  Today we are giving you a handout on hemorrhoids to read.   I appreciate the opportunity to care for you.

## 2013-08-30 NOTE — Assessment & Plan Note (Signed)
This persists. I did explain that I believe he has symptoms, but that we could not substantiate the parasites there was no specific treatment for that.

## 2013-09-02 ENCOUNTER — Encounter: Payer: Self-pay | Admitting: Specialist

## 2015-01-27 ENCOUNTER — Emergency Department (HOSPITAL_COMMUNITY)
Admission: EM | Admit: 2015-01-27 | Discharge: 2015-01-27 | Disposition: A | Discharge status: Home / Self Care | Payer: Medicaid Other | Attending: Emergency Medicine | Admitting: Emergency Medicine

## 2015-01-27 ENCOUNTER — Encounter (HOSPITAL_COMMUNITY): Payer: Self-pay | Admitting: *Deleted

## 2015-01-27 DIAGNOSIS — R195 Other fecal abnormalities: Secondary | ICD-10-CM | POA: Insufficient documentation

## 2015-01-27 DIAGNOSIS — G8929 Other chronic pain: Secondary | ICD-10-CM | POA: Diagnosis not present

## 2015-01-27 DIAGNOSIS — G40909 Epilepsy, unspecified, not intractable, without status epilepticus: Secondary | ICD-10-CM | POA: Insufficient documentation

## 2015-01-27 DIAGNOSIS — F329 Major depressive disorder, single episode, unspecified: Secondary | ICD-10-CM | POA: Insufficient documentation

## 2015-01-27 DIAGNOSIS — M25512 Pain in left shoulder: Secondary | ICD-10-CM | POA: Insufficient documentation

## 2015-01-27 DIAGNOSIS — Z79899 Other long term (current) drug therapy: Secondary | ICD-10-CM | POA: Insufficient documentation

## 2015-01-27 DIAGNOSIS — Z86010 Personal history of colonic polyps: Secondary | ICD-10-CM | POA: Diagnosis not present

## 2015-01-27 DIAGNOSIS — Z8619 Personal history of other infectious and parasitic diseases: Secondary | ICD-10-CM | POA: Insufficient documentation

## 2015-01-27 NOTE — ED Notes (Signed)
Pt c/o swelling and pain to bilateral shoulders L>R.  Pt states he feels like injury occurred while lifting weights two weeks ago.  Pt also c/o bilateral ankle swelling.  Pt also states he has "worms" in his stool - pt describes them as white small worms x 4 years.

## 2015-01-27 NOTE — Discharge Instructions (Signed)
Please follow-up with orthopedic surgeon attached. Please contact her primary care provider for further evaluation and management of ongoing chronic conditions.

## 2015-01-27 NOTE — ED Notes (Signed)
Pt complains of swelling and pain his shoulders for 2 weeks. Pt states he was lifting weights 2 weeks ago when the symptoms started. Pt states the swelling has decreased in his right shoulder but that the swelling remains in his left. Pt states he also has seen worms in his stool, which he states started in prison 4 years ago. Pt states he had a colonoscopy performed and was told they did not find anything.

## 2015-01-27 NOTE — ED Provider Notes (Signed)
CSN: [id number]     Arrival date & time 01/27/15  1349 History   None    Chief Complaint  Patient presents with  . Shoulder Pain  . Worms in stool    HPI   49 year old male presents today with left shoulder pain. Patient reports that 2 weeks ago he was lifting weights, reports very light amount of weight, developed soreness in the following days. Patient reports reduced range of motion of the left shoulder, pain localized to the posterior aspect of the shoulder. Patient reports subjective swelling of the shoulder, also reports soreness in the right shoulder, no swelling. Patient denies loss of distal strength of the affected extremity, normal sensation. Patient also reports today worms in his stools, requesting medication for this. Chart review shows he's had this complaint numerous times with thorough evaluation including colonoscopy with no significant findings.    Past Medical History  Diagnosis Date  . Psychosis     Questioned schizophrenia  . Substance abuse   . Depression   . Chronic headache   . Seizure disorder   . Colon polyp, hyperplastic   . 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 Gonorrhea    Past Surgical History  Procedure Laterality Date  . Skull fracture elevation    . Colonoscopy  09/04/2009    Dr. Silvano Rusk   Family History  Problem Relation Age of Onset  . Diabetes Mother    History  Substance Use Topics  . Smoking status: Never Smoker   . Smokeless tobacco: Never Used  . Alcohol Use: No    Review of Systems  All other systems reviewed and are negative.   Allergies  Motrin; Citric acid; and Tomato  Home Medications   Prior to Admission medications   Medication Sig Start Date End Date Taking? Authorizing Provider  benztropine (COGENTIN) 1 MG tablet Take 1 mg by mouth 2 (two) times daily.     Yes Historical Provider, MD  phenytoin (DILANTIN) 100 MG ER capsule Take 400 mg by mouth every evening.    Yes Historical Provider, MD  QUEtiapine  (SEROQUEL) 400 MG tablet Take 400 mg by mouth 2 (two) times daily.    Yes Historical Provider, MD   BP 127/65 mmHg  Pulse 75  Temp(Src) 97.6 F (36.4 C) (Oral)  Resp 14  SpO2 99%   Physical Exam  Constitutional: He is oriented to person, place, and time. He appears well-developed and well-nourished.  HENT:  Head: Normocephalic and atraumatic.  Eyes: Conjunctivae are normal. Pupils are equal, round, and reactive to light. Right eye exhibits no discharge. Left eye exhibits no discharge. No scleral icterus.  Neck: Normal range of motion. No JVD present. No tracheal deviation present.  Pulmonary/Chest: Effort normal. No stridor.  Musculoskeletal:  Left UE: Reduced active and passive range of motion in all directions due to pain. Grip strength 5/5, distal sensation grossly intact.   Neurological: He is alert and oriented to person, place, and time. Coordination normal.  Psychiatric: He has a normal mood and affect. His behavior is normal. Judgment and thought content normal.  Nursing note and vitals reviewed.   ED Course  Procedures (including critical care time) Labs Review Labs Reviewed - No data to display  Imaging Review No results found.   EKG Interpretation None      MDM   Final diagnoses:  Shoulder pain, acute, left    Labs:  Imaging: none indication  Consults:  Therapeutics:  Plan: Pt presents with left shoulder pain. No  signigicant MOI that would indicate imaging. Pt also notes "worms in his stool". Chart review shows this is an ongoing complaint with full investigation including colonoscopy without significant findings. Pt discharged home with instructions to use ICE/ Heat, tylenol as needed for pain and to follow-up with orthopedic surgeon for further evaluation and management. No further questions or concerns at the time discharge.       Okey Regal, PA-C 01/29/15 1357  Lacretia Leigh, MD 01/29/15 2035

## 2015-06-08 ENCOUNTER — Encounter: Payer: Self-pay | Admitting: Internal Medicine

## 2016-07-12 ENCOUNTER — Encounter (HOSPITAL_COMMUNITY): Payer: Self-pay | Admitting: *Deleted

## 2016-07-12 ENCOUNTER — Emergency Department (HOSPITAL_COMMUNITY): Payer: Medicaid Other

## 2016-07-12 ENCOUNTER — Emergency Department (HOSPITAL_COMMUNITY)
Admission: EM | Admit: 2016-07-12 | Discharge: 2016-07-12 | Disposition: A | Discharge status: Home / Self Care | Payer: Medicaid Other | Attending: Emergency Medicine | Admitting: Emergency Medicine

## 2016-07-12 DIAGNOSIS — E875 Hyperkalemia: Secondary | ICD-10-CM | POA: Diagnosis not present

## 2016-07-12 DIAGNOSIS — M7032 Other bursitis of elbow, left elbow: Secondary | ICD-10-CM | POA: Diagnosis not present

## 2016-07-12 DIAGNOSIS — Z7984 Long term (current) use of oral hypoglycemic drugs: Secondary | ICD-10-CM | POA: Diagnosis not present

## 2016-07-12 DIAGNOSIS — E1165 Type 2 diabetes mellitus with hyperglycemia: Secondary | ICD-10-CM | POA: Insufficient documentation

## 2016-07-12 DIAGNOSIS — Y939 Activity, unspecified: Secondary | ICD-10-CM | POA: Insufficient documentation

## 2016-07-12 DIAGNOSIS — L03114 Cellulitis of left upper limb: Secondary | ICD-10-CM

## 2016-07-12 DIAGNOSIS — E119 Type 2 diabetes mellitus without complications: Secondary | ICD-10-CM | POA: Insufficient documentation

## 2016-07-12 DIAGNOSIS — R739 Hyperglycemia, unspecified: Secondary | ICD-10-CM

## 2016-07-12 DIAGNOSIS — M25522 Pain in left elbow: Secondary | ICD-10-CM | POA: Diagnosis present

## 2016-07-12 LAB — CBC WITH DIFFERENTIAL/PLATELET
BASOS ABS: 0 10*3/uL (ref 0.0–0.1)
BASOS PCT: 0 %
Eosinophils Absolute: 0 10*3/uL (ref 0.0–0.7)
Eosinophils Relative: 0 %
HEMATOCRIT: 43.8 % (ref 39.0–52.0)
HEMOGLOBIN: 15.1 g/dL (ref 13.0–17.0)
Lymphocytes Relative: 21 %
Lymphs Abs: 2.7 10*3/uL (ref 0.7–4.0)
MCH: 29.7 pg (ref 26.0–34.0)
MCHC: 34.5 g/dL (ref 30.0–36.0)
MCV: 86.1 fL (ref 78.0–100.0)
MONO ABS: 0.8 10*3/uL (ref 0.1–1.0)
Monocytes Relative: 6 %
NEUTROS ABS: 9.2 10*3/uL — AB (ref 1.7–7.7)
NEUTROS PCT: 73 %
Platelets: 273 10*3/uL (ref 150–400)
RBC: 5.09 MIL/uL (ref 4.22–5.81)
RDW: 12.5 % (ref 11.5–15.5)
WBC: 12.7 10*3/uL — AB (ref 4.0–10.5)

## 2016-07-12 LAB — URINALYSIS, ROUTINE W REFLEX MICROSCOPIC
BACTERIA UA: NONE SEEN
BILIRUBIN URINE: NEGATIVE
Hgb urine dipstick: NEGATIVE
KETONES UR: NEGATIVE mg/dL
LEUKOCYTES UA: NEGATIVE
NITRITE: NEGATIVE
PH: 6 (ref 5.0–8.0)
Protein, ur: NEGATIVE mg/dL
RBC / HPF: NONE SEEN RBC/hpf (ref 0–5)
Specific Gravity, Urine: 1.028 (ref 1.005–1.030)

## 2016-07-12 LAB — BASIC METABOLIC PANEL
ANION GAP: 7 (ref 5–15)
BUN: 21 mg/dL — ABNORMAL HIGH (ref 6–20)
CALCIUM: 9.8 mg/dL (ref 8.9–10.3)
CO2: 30 mmol/L (ref 22–32)
Chloride: 88 mmol/L — ABNORMAL LOW (ref 101–111)
Creatinine, Ser: 1.69 mg/dL — ABNORMAL HIGH (ref 0.61–1.24)
GFR calc non Af Amer: 45 mL/min — ABNORMAL LOW (ref >60)
GFR, EST AFRICAN AMERICAN: 52 mL/min — AB (ref >60)
Glucose, Bld: 694 mg/dL (ref 65–99)
Potassium: 5.7 mmol/L — ABNORMAL HIGH (ref 3.5–5.1)
Sodium: 125 mmol/L — ABNORMAL LOW (ref 135–145)

## 2016-07-12 LAB — CBG MONITORING, ED
Glucose-Capillary: 565 mg/dL (ref 65–99)
Glucose-Capillary: 406 mg/dL — ABNORMAL HIGH (ref 65–99)

## 2016-07-12 MED ORDER — CEPHALEXIN 500 MG PO CAPS: 500.0000 mg | ORAL_CAPSULE | Freq: Four times a day (QID) | ORAL | 0 refills | Status: DC

## 2016-07-12 MED ORDER — INSULIN ASPART 100 UNIT/ML ~~LOC~~ SOLN
10.0000 [IU] | Freq: Once | SUBCUTANEOUS | Status: AC
  Administered 2016-07-12: 10 [IU] via SUBCUTANEOUS
  Filled 2016-07-12: qty 1

## 2016-07-12 MED ORDER — MORPHINE SULFATE (PF) 4 MG/ML IV SOLN
4.0000 mg | Freq: Once | INTRAVENOUS | Status: AC
  Administered 2016-07-12: 4 mg via INTRAVENOUS
  Filled 2016-07-12: qty 1

## 2016-07-12 MED ORDER — TRAMADOL HCL 50 MG PO TABS: 50.0000 mg | ORAL_TABLET | Freq: Four times a day (QID) | ORAL | 0 refills | Status: AC | PRN

## 2016-07-12 MED ORDER — CEFAZOLIN IN D5W 1 GM/50ML IV SOLN
1.0000 g | Freq: Once | INTRAVENOUS | Status: AC
  Administered 2016-07-12: 1 g via INTRAVENOUS
  Filled 2016-07-12: qty 50

## 2016-07-12 MED ORDER — SODIUM CHLORIDE 0.9 % IV BOLUS (SEPSIS)
1000.0000 mL | Freq: Once | INTRAVENOUS | Status: AC
  Administered 2016-07-12: 1000 mL via INTRAVENOUS

## 2016-07-12 MED ORDER — INSULIN GLARGINE 100 UNIT/ML ~~LOC~~ SOLN
10.0000 [IU] | Freq: Once | SUBCUTANEOUS | Status: AC
  Administered 2016-07-12: 10 [IU] via SUBCUTANEOUS
  Filled 2016-07-12: qty 0.1

## 2016-07-12 NOTE — ED Notes (Signed)
Pt denies any injury, has hx of old elbow injury with dislocation and cast in past.

## 2016-07-12 NOTE — ED Notes (Signed)
Pt verbalized understanding discharge instructions and denies any further needs or questions at this time. VS stable, ambulatory and steady gait.   

## 2016-07-12 NOTE — Discharge Instructions (Signed)
Follow up with your primary care doctor this week as instructed by the family medicine service, continue the abx and your diabetes medications, return as needed for worsening symptoms

## 2016-07-12 NOTE — ED Provider Notes (Addendum)
[facility] DEPT Provider Note   CSN: [id number] Arrival date & time: 07/12/16  1033  By signing my name below, I, Jeremiah Mcguire, attest that this documentation has been prepared under the direction and in the presence of Dorie Rank, MD . Electronically Signed: Higinio Mcguire, Scribe. 07/12/2016. 12:37 PM.  History   Chief Complaint Chief Complaint  Patient presents with  . Elbow Pain   The history is provided by the patient. Jeremiah language interpreter was used.   HPI Comments: NOOH Jeremiah Mcguire is a 50 y.o. male with PMHx of substance abuse and Ekbom's delusional parasitosis, who presents to the Emergency Department complaining of gradually worsening, left elbow pain that began ~1 week ago. Pt reports hx of left elbow dislocation and states his current pain feels similar. He states he visited his orthopedic specialist for his pain a few days ago and was told it was caused by "a bone underneath it." He notes he was told to treat his pain with ice which he states has not relieved his pain. He reports he also has a "small scab" to the area with swelling and states associated intermittent, fever and chills.   Past Medical History:  Diagnosis Date  . Chronic headache   . Colon polyp, hyperplastic   . Depression   . Diabetes mellitus (Jeremiah Mcguire)   . 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 Gonorrhea   . Psychosis    Questioned schizophrenia  . Seizure disorder (Jeremiah Mcguire)   . Substance abuse     Patient Active Problem List   Diagnosis Date Noted  . Diabetes mellitus (Jeremiah Mcguire)   . Hemorrhoids, internal, with bleeding 08/30/2013  . Ekbom's delusional parasitosis ([name]) 12/21/2011  . Paresthesias 12/21/2011  . Seizure disorder ([name])   . PSYCHOSIS 11/02/2009  . SUBSTANCE ABUSE, MULTIPLE 11/02/2009  . PRURITUS ANI 11/02/2009  . DEPRESSION 09/21/2009  . HEADACHE, CHRONIC 09/21/2009    Past Surgical History:  Procedure Laterality Date  . COLONOSCOPY  09/04/2009   Dr. Silvano Rusk  . SKULL  FRACTURE ELEVATION      Home Medications    Prior to Admission medications   Medication Sig Start Date End Date Taking? Authorizing Provider  ARIPiprazole (ABILIFY) 30 MG tablet Take 30 mg by mouth daily.   Yes Historical Provider, MD  atorvastatin (LIPITOR) 20 MG tablet Take 20 mg by mouth daily.   Yes Historical Provider, MD  benztropine (COGENTIN) 1 MG tablet Take 1 mg by mouth 2 (two) times daily.     Yes Historical Provider, MD  cholecalciferol (VITAMIN D) 1000 units tablet Take 1,000 Units by mouth daily.   Yes Historical Provider, MD  Omega-3 Fatty Acids (FISH OIL) 1000 MG CPDR Take 1,000 mg by mouth daily.   Yes Historical Provider, MD  sitaGLIPtin-metformin (JANUMET) 50-1000 MG tablet Take 1 tablet by mouth daily. Pt getting samples from MD office.   Yes Historical Provider, MD  phenytoin (DILANTIN) 100 MG ER capsule Take 400 mg by mouth every evening.     Historical Provider, MD  QUEtiapine (SEROQUEL) 400 MG tablet Take 400 mg by mouth 2 (two) times daily.     Historical Provider, MD    Family History Family History  Problem Relation Age of Onset  . Diabetes Mother     Social History Social History  Substance Use Topics  . Smoking status: Never Smoker  . Smokeless tobacco: Never Used  . Alcohol use Jeremiah     Allergies   Motrin [ibuprofen]; Citric acid; and Tomato  Review of Systems Review of Systems  Constitutional: Positive for chills and fever.  Musculoskeletal: Positive for arthralgias.   Physical Exam Updated Vital Signs BP 126/75 (BP Location: Right Arm)   Pulse 109   Temp 97.9 F (36.6 C) (Oral)   Resp 20   Ht 6' (1.829 m)   Wt 230 lb (104.3 kg)   SpO2 99%   BMI 31.19 kg/m   Physical Exam  Constitutional: He appears well-developed and well-nourished. Jeremiah distress.  HENT:  Head: Normocephalic and atraumatic.  Right Ear: External ear normal.  Left Ear: External ear normal.  Eyes: Conjunctivae are normal. Right eye exhibits Jeremiah discharge. Left eye  exhibits Jeremiah discharge. Jeremiah scleral icterus.  Neck: Neck supple. Jeremiah tracheal deviation present.  Cardiovascular: Normal rate.   Pulmonary/Chest: Effort normal. Jeremiah stridor. Jeremiah respiratory distress.  Abdominal: He exhibits Jeremiah distension.  Musculoskeletal:       Left elbow: He exhibits decreased range of motion and swelling. Tenderness found.       Left forearm: He exhibits tenderness, swelling and edema.  Small scab overlying olecranon process, surrounding erythema and edema of olecranon process and proximal forearm.   Neurological: He is alert. Cranial nerve deficit: Jeremiah gross deficits.  Skin: Skin is warm and dry. Jeremiah rash noted.  Psychiatric: He has a normal mood and affect.  Nursing note and vitals reviewed.  ED Treatments / Results  Labs (all labs ordered are listed, but only abnormal results are displayed) Labs Reviewed  CBC WITH DIFFERENTIAL/PLATELET - Abnormal; Notable for the following:       Result Value   WBC 12.7 (*)    Neutro Abs 9.2 (*)    All other components within normal limits  BASIC METABOLIC PANEL - Abnormal; Notable for the following:    Sodium 125 (*)    Potassium 5.7 (*)    Chloride 88 (*)    Glucose, Bld 694 (*)    BUN 21 (*)    Creatinine, Ser 1.69 (*)    GFR calc non Af Amer 45 (*)    GFR calc Af Amer 52 (*)    All other components within normal limits     Radiology Dg Elbow Complete Left  Result Date: 07/12/2016 CLINICAL DATA:  Pain and swelling, Jeremiah known injury, initial encounter EXAM: LEFT ELBOW - COMPLETE 3+ VIEW COMPARISON:  05/09/2011 FINDINGS: Mild degenerative changes are noted in the radial humeral joint. Spurring is noted from the olecranon stable from the prior exam. Soft tissue swelling is seen likely related to an underlying bursitis. IMPRESSION: Jeremiah acute bony abnormality is noted. Chronic changes are seen as described. Changes suggestive of olecranon bursitis. Electronically Signed   By: Inez Catalina M.D.   On: 07/12/2016 11:20     Procedures Procedures (including critical care time)  Medications Ordered in ED Medications  ceFAZolin (ANCEF) IVPB 1 g/50 mL premix (0 g Intravenous Stopped 07/12/16 1444)  morphine 4 MG/ML injection 4 mg (4 mg Intravenous Given 07/12/16 1313)  sodium chloride 0.9 % bolus 1,000 mL (1,000 mLs Intravenous New Bag/Given 07/12/16 1440)  insulin aspart (novoLOG) injection 10 Units (10 Units Subcutaneous Given 07/12/16 1440)    DIAGNOSTIC STUDIES:  Oxygen Saturation is 99% on RA, normal by my interpretation.    COORDINATION OF CARE:  12:26 PM Discussed treatment Mcguire with pt at bedside and pt agreed to Mcguire.  Initial Impression / Assessment and Mcguire / ED Course  I have reviewed the triage vital signs and the nursing notes.  Pertinent labs & imaging results that were available during my care of the patient were reviewed by me and considered in my medical decision making (see chart for details).  Clinical Course as of Jul 12 1624  Tue Jul 12, 2016  1407 Pt has elevated blood sugar.  Consistent with newly diagnosed diabetes.  Suspect elbow and forearm swelling is related to infection.  Will start IV abx and admit  [JK]  1432 I reviewed the findings with the patient.  He states he does have diabetes and takes a pill for it.  He cannot tell me which Mcguire.  Considering his elevated blood sugar, cellulitis and associated bursitis I will start him on Iv abx.  Consult for admission  [JK]    Clinical Course User Index [JK] Dorie Rank, MD      Final Clinical Impressions(s) / ED Diagnoses   Final diagnoses:  Hyperkalemia  Cellulitis of left arm  Hyperglycemia  Bursitis of left elbow, unspecified bursa   I personally performed the services described in this documentation, which was scribed in my presence.  The recorded information has been reviewed and is accurate.       Dorie Rank, MD 07/12/16 1451  Family medicine service saw the patient and discussed outpatient treatment with  close follow up.  Pt preferred this option.  Requested an additional dose of lantus.     Dorie Rank, MD 07/12/16 [phone]

## 2016-07-12 NOTE — ED Triage Notes (Signed)
Pt reports left elbow pain and decreased ROM for several days. Hx of dislocation and reports this feels similar to when it was dislocated. No acute distress noted at triage, +radial.

## 2016-07-12 NOTE — ED Notes (Signed)
Critical glucose level of 694 noted by lab. Rn and Dr aware.

## 2016-07-12 NOTE — ED Notes (Signed)
Pt given ginger ale and [place] sandwich per Dr.
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
## 2016-07-14 ENCOUNTER — Encounter (INDEPENDENT_AMBULATORY_CARE_PROVIDER_SITE_OTHER): Payer: Self-pay | Admitting: Orthopaedic Surgery

## 2016-07-14 ENCOUNTER — Encounter (INDEPENDENT_AMBULATORY_CARE_PROVIDER_SITE_OTHER): Payer: Self-pay

## 2016-07-14 ENCOUNTER — Ambulatory Visit (INDEPENDENT_AMBULATORY_CARE_PROVIDER_SITE_OTHER): Payer: Medicaid Other | Admitting: Orthopaedic Surgery

## 2016-07-14 ENCOUNTER — Ambulatory Visit (INDEPENDENT_AMBULATORY_CARE_PROVIDER_SITE_OTHER): Payer: Self-pay | Admitting: Orthopaedic Surgery

## 2016-07-14 DIAGNOSIS — M25522 Pain in left elbow: Secondary | ICD-10-CM | POA: Diagnosis not present

## 2016-07-14 MED ORDER — SULFAMETHOXAZOLE-TRIMETHOPRIM 800-160 MG PO TABS: 1.0000 | ORAL_TABLET | Freq: Two times a day (BID) | ORAL | 0 refills | Status: DC

## 2016-07-14 NOTE — Progress Notes (Signed)
Office Visit Note   Patient: Jeremiah Mcguire           Date of Birth: 09/04/1965           MRN: [id number] Visit Date: 07/14/2016              Requested by: [facility] [id number], [place] [id number] [facility]   Assessment & Plan: Visit Diagnoses:  1. Pain in left elbow     Plan: Under sterile conditions I did aspirate approximately 6-7 mL of frank pus. Sterile dressings were applied. Did send this off for Gram stain and culture. I'm going to empirically put him on Bactrim and taken off of Keflex. I want to see him back early next week for a recheck. If not significantly better we will take to the operating room for formal washout  Follow-Up Instructions: Return in 4 days (on 07/18/2016) for recheck left olecranon bursitis.   Orders:  Orders Placed This Encounter  Procedures  . Gram stain  . Body Fluid Culture   Meds ordered this encounter  Medications  . sulfamethoxazole-trimethoprim (BACTRIM DS,SEPTRA DS) 800-160 MG tablet    Sig: Take 1 tablet by mouth 2 (two) times daily.    Dispense:  20 tablet    Refill:  0      Procedures: No procedures performed   Clinical Data: No additional findings.   Subjective: Chief Complaint  Patient presents with  . Left Elbow - Pain    Patient is a 50 year old gentleman with left elbow pain since 07/12/2016. He was diagnosed with cellulitis of the elbow and was placed on Keflex. He states that he started having drainage from the bursa with an opening of a small wound it's been warm and red is been putting warm compresses on it. He denies any constitutional symptoms. Denies any trauma.    Review of Systems  Constitutional: Negative.   HENT: Negative.   Eyes: Negative.   Respiratory: Negative.   Cardiovascular: Negative.   Gastrointestinal: Negative.   Endocrine: Negative.   Genitourinary: Negative.   Musculoskeletal: Negative.   Skin: Negative.   Allergic/Immunologic:  Negative.   Neurological: Negative.   Hematological: Negative.   Psychiatric/Behavioral: Negative.      Objective: Vital Signs: There were no vitals taken for this visit.  Physical Exam  Constitutional: He is oriented to person, place, and time. He appears well-developed and well-nourished.  HENT:  Head: Normocephalic and atraumatic.  Eyes: EOM are normal.  Neck: Neck supple.  Cardiovascular: Intact distal pulses.   Pulmonary/Chest: Effort normal.  Abdominal: Soft.  Neurological: He is alert and oriented to person, place, and time.  Skin: Skin is warm.  Psychiatric: He has a normal mood and affect. His behavior is normal. Judgment and thought content normal.  Nursing note and vitals reviewed.   Ortho Exam Exam of the left elbow shows fluctuant olecranon bursa with purulent drainage. He does have surrounding cellulitis. The extremity is neurovascularly intact. He has good range of motion of the elbow without pain. Muscular compartments are soft. Specialty Comments:  No specialty comments available.  Imaging: No results found.   PMFS History: Patient Active Problem List   Diagnosis Date Noted  . Diabetes mellitus ([place])   . Hemorrhoids, internal, with bleeding 08/30/2013  . Ekbom's delusional parasitosis ([place]) 12/21/2011  . Paresthesias 12/21/2011  . Seizure disorder ([place])   . PSYCHOSIS 11/02/2009  . SUBSTANCE ABUSE, MULTIPLE 11/02/2009  . PRURITUS ANI 11/02/2009  .  DEPRESSION 09/21/2009  . HEADACHE, CHRONIC 09/21/2009   Past Medical History:  Diagnosis Date  . Chronic headache   . Colon polyp, hyperplastic   . Depression   . Diabetes mellitus ([place])   . 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 Gonorrhea   . Psychosis    Questioned schizophrenia  . Seizure disorder ([place])   . Substance abuse     Family History  Problem Relation Age of Onset  . Diabetes Mother     Past Surgical History:  Procedure Laterality Date  . COLONOSCOPY  09/04/2009   Dr.  Silvano Rusk  . SKULL FRACTURE ELEVATION     Social History   Occupational History  . Unemployed Disabled   Social History Main Topics  . Smoking status: Never Smoker  . Smokeless tobacco: Never Used  . Alcohol use No  . Drug use: No  . Sexual activity: Not on file

## 2016-07-16 LAB — BODY FLUID CULTURE: GRAM STAIN: NONE SEEN

## 2016-07-18 ENCOUNTER — Encounter (HOSPITAL_COMMUNITY): Payer: Self-pay | Admitting: Internal Medicine

## 2016-07-18 ENCOUNTER — Ambulatory Visit (INDEPENDENT_AMBULATORY_CARE_PROVIDER_SITE_OTHER): Payer: Medicaid Other | Admitting: Orthopaedic Surgery

## 2016-07-18 ENCOUNTER — Other Ambulatory Visit (INDEPENDENT_AMBULATORY_CARE_PROVIDER_SITE_OTHER): Payer: Self-pay | Admitting: Orthopaedic Surgery

## 2016-07-18 ENCOUNTER — Telehealth (INDEPENDENT_AMBULATORY_CARE_PROVIDER_SITE_OTHER): Payer: Self-pay | Admitting: Orthopaedic Surgery

## 2016-07-18 ENCOUNTER — Inpatient Hospital Stay (HOSPITAL_BASED_OUTPATIENT_CLINIC_OR_DEPARTMENT_OTHER)
Admission: AD | Admit: 2016-07-18 | Discharge: 2016-07-22 | DRG: 501 | Disposition: A | Discharge status: Home / Self Care | Payer: Medicaid Other | Source: Ambulatory Visit | Attending: Family Medicine | Admitting: Family Medicine

## 2016-07-18 DIAGNOSIS — E11628 Type 2 diabetes mellitus with other skin complications: Secondary | ICD-10-CM | POA: Diagnosis present

## 2016-07-18 DIAGNOSIS — L0390 Cellulitis, unspecified: Secondary | ICD-10-CM | POA: Diagnosis present

## 2016-07-18 DIAGNOSIS — E1122 Type 2 diabetes mellitus with diabetic chronic kidney disease: Secondary | ICD-10-CM | POA: Diagnosis present

## 2016-07-18 DIAGNOSIS — L03114 Cellulitis of left upper limb: Secondary | ICD-10-CM | POA: Diagnosis present

## 2016-07-18 DIAGNOSIS — N179 Acute kidney failure, unspecified: Secondary | ICD-10-CM | POA: Diagnosis present

## 2016-07-18 DIAGNOSIS — M7022 Olecranon bursitis, left elbow: Secondary | ICD-10-CM | POA: Diagnosis not present

## 2016-07-18 DIAGNOSIS — Z833 Family history of diabetes mellitus: Secondary | ICD-10-CM | POA: Diagnosis not present

## 2016-07-18 DIAGNOSIS — F329 Major depressive disorder, single episode, unspecified: Secondary | ICD-10-CM | POA: Diagnosis present

## 2016-07-18 DIAGNOSIS — Z886 Allergy status to analgesic agent status: Secondary | ICD-10-CM | POA: Diagnosis not present

## 2016-07-18 DIAGNOSIS — L02414 Cutaneous abscess of left upper limb: Secondary | ICD-10-CM | POA: Diagnosis present

## 2016-07-18 DIAGNOSIS — Z79899 Other long term (current) drug therapy: Secondary | ICD-10-CM | POA: Diagnosis not present

## 2016-07-18 DIAGNOSIS — B9561 Methicillin susceptible Staphylococcus aureus infection as the cause of diseases classified elsewhere: Secondary | ICD-10-CM | POA: Diagnosis present

## 2016-07-18 DIAGNOSIS — F209 Schizophrenia, unspecified: Secondary | ICD-10-CM | POA: Diagnosis present

## 2016-07-18 DIAGNOSIS — Z7984 Long term (current) use of oral hypoglycemic drugs: Secondary | ICD-10-CM | POA: Diagnosis not present

## 2016-07-18 DIAGNOSIS — M71122 Other infective bursitis, left elbow: Secondary | ICD-10-CM | POA: Insufficient documentation

## 2016-07-18 DIAGNOSIS — E1121 Type 2 diabetes mellitus with diabetic nephropathy: Secondary | ICD-10-CM | POA: Diagnosis present

## 2016-07-18 DIAGNOSIS — N182 Chronic kidney disease, stage 2 (mild): Secondary | ICD-10-CM | POA: Diagnosis present

## 2016-07-18 DIAGNOSIS — F22 Delusional disorders: Secondary | ICD-10-CM | POA: Diagnosis present

## 2016-07-18 DIAGNOSIS — Z8249 Family history of ischemic heart disease and other diseases of the circulatory system: Secondary | ICD-10-CM | POA: Diagnosis not present

## 2016-07-18 DIAGNOSIS — G40909 Epilepsy, unspecified, not intractable, without status epilepticus: Secondary | ICD-10-CM | POA: Diagnosis present

## 2016-07-18 DIAGNOSIS — Z91018 Allergy to other foods: Secondary | ICD-10-CM

## 2016-07-18 DIAGNOSIS — E1165 Type 2 diabetes mellitus with hyperglycemia: Secondary | ICD-10-CM | POA: Diagnosis present

## 2016-07-18 DIAGNOSIS — M25522 Pain in left elbow: Secondary | ICD-10-CM | POA: Diagnosis present

## 2016-07-18 DIAGNOSIS — E119 Type 2 diabetes mellitus without complications: Secondary | ICD-10-CM

## 2016-07-18 HISTORY — DX: Bipolar disorder, unspecified: F31.9

## 2016-07-18 HISTORY — DX: Type 2 diabetes mellitus without complications: E11.9

## 2016-07-18 HISTORY — DX: Other infective bursitis, left elbow: M71.122

## 2016-07-18 HISTORY — DX: Migraine, unspecified, not intractable, without status migrainosus: G43.909

## 2016-07-18 LAB — COMPREHENSIVE METABOLIC PANEL
ALT: 31 U/L (ref 17–63)
ANION GAP: 9 (ref 5–15)
AST: 31 U/L (ref 15–41)
Albumin: 4 g/dL (ref 3.5–5.0)
Alkaline Phosphatase: 106 U/L (ref 38–126)
BUN: 14 mg/dL (ref 6–20)
CHLORIDE: 98 mmol/L — AB (ref 101–111)
CO2: 25 mmol/L (ref 22–32)
Calcium: 10 mg/dL (ref 8.9–10.3)
Creatinine, Ser: 1.49 mg/dL — ABNORMAL HIGH (ref 0.61–1.24)
GFR, EST NON AFRICAN AMERICAN: 52 mL/min — AB (ref >60)
Glucose, Bld: 221 mg/dL — ABNORMAL HIGH (ref 65–99)
POTASSIUM: 5.1 mmol/L (ref 3.5–5.1)
Sodium: 132 mmol/L — ABNORMAL LOW (ref 135–145)
Total Bilirubin: 0.6 mg/dL (ref 0.3–1.2)
Total Protein: 8.1 g/dL (ref 6.5–8.1)

## 2016-07-18 LAB — CBC WITH DIFFERENTIAL/PLATELET
BASOS ABS: 0 10*3/uL (ref 0.0–0.1)
Basophils Relative: 0 %
Eosinophils Absolute: 0.2 10*3/uL (ref 0.0–0.7)
Eosinophils Relative: 2 %
HEMATOCRIT: 40.2 % (ref 39.0–52.0)
HEMOGLOBIN: 13.4 g/dL (ref 13.0–17.0)
LYMPHS ABS: 3.4 10*3/uL (ref 0.7–4.0)
LYMPHS PCT: 39 %
MCH: 29.3 pg (ref 26.0–34.0)
MCHC: 33.3 g/dL (ref 30.0–36.0)
MCV: 87.8 fL (ref 78.0–100.0)
Monocytes Absolute: 0.4 10*3/uL (ref 0.1–1.0)
Monocytes Relative: 5 %
NEUTROS ABS: 4.7 10*3/uL (ref 1.7–7.7)
NEUTROS PCT: 54 %
Platelets: 272 10*3/uL (ref 150–400)
RBC: 4.58 MIL/uL (ref 4.22–5.81)
RDW: 13 % (ref 11.5–15.5)
WBC: 8.7 10*3/uL (ref 4.0–10.5)

## 2016-07-18 LAB — GLUCOSE, CAPILLARY
Glucose-Capillary: 346 mg/dL — ABNORMAL HIGH (ref 65–99)
Glucose-Capillary: 333 mg/dL — ABNORMAL HIGH (ref 65–99)

## 2016-07-18 MED ORDER — INSULIN GLARGINE 100 UNIT/ML ~~LOC~~ SOLN
10.0000 [IU] | Freq: Every day | SUBCUTANEOUS | Status: DC
  Administered 2016-07-18: 10 [IU] via SUBCUTANEOUS
  Filled 2016-07-18 (×2): qty 0.1

## 2016-07-18 MED ORDER — INSULIN ASPART 100 UNIT/ML ~~LOC~~ SOLN
0.0000 [IU] | Freq: Every day | SUBCUTANEOUS | Status: DC
  Administered 2016-07-18: 4 [IU] via SUBCUTANEOUS
  Administered 2016-07-20: 2 [IU] via SUBCUTANEOUS

## 2016-07-18 MED ORDER — ATORVASTATIN CALCIUM 20 MG PO TABS
20.0000 mg | ORAL_TABLET | Freq: Every day | ORAL | Status: DC
  Administered 2016-07-19 – 2016-07-21 (×3): 20 mg via ORAL
  Filled 2016-07-18 (×3): qty 1

## 2016-07-18 MED ORDER — ONDANSETRON HCL 4 MG/2ML IJ SOLN: 4.0000 mg | Freq: Four times a day (QID) | INTRAMUSCULAR | Status: DC | PRN

## 2016-07-18 MED ORDER — ONDANSETRON HCL 4 MG PO TABS: 4.0000 mg | ORAL_TABLET | Freq: Four times a day (QID) | ORAL | Status: DC | PRN

## 2016-07-18 MED ORDER — QUETIAPINE FUMARATE 400 MG PO TABS
400.0000 mg | ORAL_TABLET | Freq: Two times a day (BID) | ORAL | Status: DC
  Administered 2016-07-18 – 2016-07-22 (×7): 400 mg via ORAL
  Filled 2016-07-18 (×9): qty 1

## 2016-07-18 MED ORDER — ACETAMINOPHEN 650 MG RE SUPP: 650.0000 mg | Freq: Four times a day (QID) | RECTAL | Status: DC | PRN

## 2016-07-18 MED ORDER — ACETAMINOPHEN 325 MG PO TABS: 650.0000 mg | ORAL_TABLET | Freq: Four times a day (QID) | ORAL | Status: DC | PRN

## 2016-07-18 MED ORDER — PHENYTOIN SODIUM EXTENDED 100 MG PO CAPS: 400.0000 mg | ORAL_CAPSULE | Freq: Every evening | ORAL | Status: DC

## 2016-07-18 MED ORDER — POLYETHYLENE GLYCOL 3350 17 G PO PACK: 17.0000 g | PACK | Freq: Every day | ORAL | Status: DC | PRN

## 2016-07-18 MED ORDER — CEFAZOLIN SODIUM-DEXTROSE 2-4 GM/100ML-% IV SOLN
2.0000 g | Freq: Three times a day (TID) | INTRAVENOUS | Status: DC
  Administered 2016-07-18 – 2016-07-21 (×8): 2 g via INTRAVENOUS
  Filled 2016-07-18 (×10): qty 100

## 2016-07-18 MED ORDER — HEPARIN SODIUM (PORCINE) 5000 UNIT/ML IJ SOLN
5000.0000 [IU] | Freq: Three times a day (TID) | INTRAMUSCULAR | Status: DC
  Administered 2016-07-18 – 2016-07-19 (×2): 5000 [IU] via SUBCUTANEOUS
  Filled 2016-07-18 (×2): qty 1

## 2016-07-18 MED ORDER — ARIPIPRAZOLE 10 MG PO TABS
30.0000 mg | ORAL_TABLET | Freq: Every day | ORAL | Status: DC
  Administered 2016-07-19 – 2016-07-22 (×4): 30 mg via ORAL
  Filled 2016-07-18 (×2): qty 3
  Filled 2016-07-18: qty 2
  Filled 2016-07-18 (×2): qty 3

## 2016-07-18 MED ORDER — TRAMADOL HCL 50 MG PO TABS
50.0000 mg | ORAL_TABLET | Freq: Four times a day (QID) | ORAL | Status: DC | PRN
Start: 2016-07-18 — End: 2016-07-22
  Administered 2016-07-20: 50 mg via ORAL
  Filled 2016-07-18: qty 1

## 2016-07-18 MED ORDER — SODIUM CHLORIDE 0.9 % IV SOLN
INTRAVENOUS | Status: AC
  Administered 2016-07-18: 22:00:00 via INTRAVENOUS

## 2016-07-18 MED ORDER — HYDROCODONE-ACETAMINOPHEN 5-325 MG PO TABS
1.0000 | ORAL_TABLET | ORAL | Status: DC | PRN
  Administered 2016-07-20: 1 via ORAL
  Filled 2016-07-18: qty 1

## 2016-07-18 MED ORDER — BENZTROPINE MESYLATE 1 MG PO TABS
1.0000 mg | ORAL_TABLET | Freq: Two times a day (BID) | ORAL | Status: DC
  Administered 2016-07-18 – 2016-07-22 (×7): 1 mg via ORAL
  Filled 2016-07-18 (×7): qty 1

## 2016-07-18 MED ORDER — INSULIN ASPART 100 UNIT/ML ~~LOC~~ SOLN
0.0000 [IU] | Freq: Three times a day (TID) | SUBCUTANEOUS | Status: DC
  Administered 2016-07-19: 15 [IU] via SUBCUTANEOUS
  Administered 2016-07-19: 2 [IU] via SUBCUTANEOUS
  Administered 2016-07-19: 8 [IU] via SUBCUTANEOUS
  Administered 2016-07-20: 3 [IU] via SUBCUTANEOUS
  Administered 2016-07-20: 5 [IU] via SUBCUTANEOUS
  Administered 2016-07-20: 2 [IU] via SUBCUTANEOUS
  Administered 2016-07-21: 8 [IU] via SUBCUTANEOUS
  Administered 2016-07-21: 5 [IU] via SUBCUTANEOUS
  Administered 2016-07-21 – 2016-07-22 (×2): 3 [IU] via SUBCUTANEOUS

## 2016-07-18 MED ORDER — SENNA 8.6 MG PO TABS
1.0000 | ORAL_TABLET | Freq: Two times a day (BID) | ORAL | Status: DC
  Administered 2016-07-20 – 2016-07-21 (×2): 8.6 mg via ORAL
  Filled 2016-07-18 (×6): qty 1

## 2016-07-18 NOTE — Progress Notes (Signed)
Pt arrived on unit. MD made aware. Pt in stable condition. VS check, pt assessment completed, and call bell within reach.  Will continue to monitor.

## 2016-07-18 NOTE — Telephone Encounter (Signed)
Let sherrie know

## 2016-07-18 NOTE — Telephone Encounter (Signed)
See message below °

## 2016-07-18 NOTE — Telephone Encounter (Signed)
I called patient several times and no answer and voice mail not set up.

## 2016-07-18 NOTE — H&P (Signed)
Jeremiah Mcguire [id number] DOB: 18-Jan-1966 DOA: 07/18/2016     PCP: [facility]   Outpatient Specialists: ID [name], GI Gessner  Cardiology [place] Patient coming from: home Lives With girlfriend   Chief Complaint: elbow pain   HPI: Jeremiah Mcguire is a 50 y.o. male with medical history significant of substance abuse and Ekbom's delusional parasitosis, controlled diabetes mellitus , questionable seizure disorder, schizophrenia    Presented with 2 week hx of left elbow pain. He's been seen for this in emergency department as well as by orthopedics he reports intermittent fevers and chills. Patient states he initially had a scab that he picked off  Patient has been seen in emergency department on December 12 at that time his blood glucose was not controlled. He was seen by family medicine service and discharge to home on Keflex with follow-up. On December 14 patient was seen in our office by Dr. Erlinda Hong aspiration was done producing 6-7 mm of Pilar Plate Poss cultures later on grew MSSA patient empirically was started on Bactrim patient was seen and can today by orthopedics. The plan was to admit patient directly to hospitalist team with a plan to washout on Wednesday Regarding pertinent Chronic problems: Patient's diabetes has not been well controlled his blood sugar ranges between 500-600. Of note patient passes within evaluated for parasites in his stool which has been repeatedly negative he has history of psychiatric illness with delusional disorder Reports he was never truly diagnosed with seizures and his dilantin has been discontinued.    Hospitalist was called for admission  Septic olecranon bursitis of left elbow  Review of Systems:    Pertinent positives include:  Fevers, chills, fatigue,  Constitutional:  No weight loss, night sweats, weight loss  HEENT:  No headaches, Difficulty swallowing,Tooth/dental problems,Sore throat,  No sneezing, itching, ear ache,  nasal congestion, post nasal drip,  Cardio-vascular:  No chest pain, Orthopnea, PND, anasarca, dizziness, palpitations.no Bilateral lower extremity swelling  GI:  No heartburn, indigestion, abdominal pain, nausea, vomiting, diarrhea, change in bowel habits, loss of appetite, melena, blood in stool, hematemesis Resp:  no shortness of breath at rest. No dyspnea on exertion, No excess mucus, no productive cough, No non-productive cough, No coughing up of blood.No change in color of mucus.No wheezing. Skin:  no rash or lesions. No jaundice GU:  no dysuria, change in color of urine, no urgency or frequency. No straining to urinate.  No flank pain.  Musculoskeletal:  No joint pain or no joint swelling. No decreased range of motion. No back pain.  Psych:  No change in mood or affect. No depression or anxiety. No memory loss.  Neuro: no localizing neurological complaints, no tingling, no weakness, no double vision, no gait abnormality, no slurred speech, no confusion  As per HPI otherwise 10 point review of systems negative.   Past Medical History: Past Medical History:  Diagnosis Date  . Chronic headache   . Colon polyp, hyperplastic   . Depression   . Diabetes mellitus ([place])   . Ekbom's delusional parasitosis ([place]) 12/21/2011  . Gonorrhea   . Psychosis    Questioned schizophrenia  . Seizure disorder ([place])   . Substance abuse    Past Surgical History:  Procedure Laterality Date  . COLONOSCOPY  09/04/2009   Dr. Silvano Rusk  . SKULL FRACTURE ELEVATION       Social History:  Ambulatory  independently      reports that he has never smoked. He has never  used smokeless tobacco. He reports that he does not drink alcohol or use drugs.  Allergies:   Allergies  Allergen Reactions  . Motrin [Ibuprofen] Hives and Nausea And Vomiting  . Citric Acid Rash  . Tomato Rash       Family History:   Family History  Problem Relation Age of Onset  . Diabetes Mother   . Hypertension  Other   . CAD Neg Hx   . Cancer Neg Hx     Medications: Prior to Admission medications   Medication Sig Start Date End Date Taking? Authorizing Provider  ARIPiprazole (ABILIFY) 30 MG tablet Take 30 mg by mouth daily.    Historical Provider, MD  atorvastatin (LIPITOR) 20 MG tablet Take 20 mg by mouth daily.    Historical Provider, MD  benztropine (COGENTIN) 1 MG tablet Take 1 mg by mouth 2 (two) times daily.      Historical Provider, MD  cephALEXin (KEFLEX) 500 MG capsule Take 1 capsule (500 mg total) by mouth 4 (four) times daily. 07/12/16   Dorie Rank, MD  cholecalciferol (VITAMIN D) 1000 units tablet Take 1,000 Units by mouth daily.    Historical Provider, MD  Omega-3 Fatty Acids (FISH OIL) 1000 MG CPDR Take 1,000 mg by mouth daily.    Historical Provider, MD  phenytoin (DILANTIN) 100 MG ER capsule Take 400 mg by mouth every evening.     Historical Provider, MD  QUEtiapine (SEROQUEL) 400 MG tablet Take 400 mg by mouth 2 (two) times daily.     Historical Provider, MD  sitaGLIPtin-metformin (JANUMET) 50-1000 MG tablet Take 1 tablet by mouth daily. Pt getting samples from MD office.    Historical Provider, MD  sulfamethoxazole-trimethoprim (BACTRIM DS,SEPTRA DS) 800-160 MG tablet Take 1 tablet by mouth 2 (two) times daily. 07/14/16   Leandrew Koyanagi, MD  traMADol (ULTRAM) 50 MG tablet Take 1 tablet (50 mg total) by mouth every 6 (six) hours as needed. 07/12/16   Dorie Rank, MD    Physical Exam: Patient Vitals for the past 24 hrs:  BP Temp Temp src Pulse SpO2  07/18/16 1745 129/83 98.4 F (36.9 C) Oral 90 98 %    1. General:  in No Acute distress 2. Psychological: Alert and   Oriented 3. Head/ENT:     Dry Mucous Membranes                          Head Non traumatic, neck supple                           Poor Dentition 4. SKIN:   decreased Skin turgor,  Skin clean Dry Redness and swelling of left elbow      5. Heart: Regular rate and rhythm  Murmur, Rub or gallop 6. Lungs:  Clear to  auscultation bilaterally, no wheezes or crackles   7. Abdomen: Soft  non-tender, Non distended 8. Lower extremities: no clubbing, cyanosis, or edema 9. Neurologically Grossly intact, moving all 4 extremities equally  10. MSK: Normal range of motion   body mass index is unknown because there is no height or weight on file.  Labs on Admission:   Labs on Admission: I have personally reviewed following labs and imaging studies  CBC:  Recent Labs Lab 07/12/16 1315  WBC 12.7*  NEUTROABS 9.2*  HGB 15.1  HCT 43.8  MCV 86.1  PLT 123456   Basic Metabolic Panel:  Recent  Labs Lab 07/12/16 1315  NA 125*  K 5.7*  CL 88*  CO2 30  GLUCOSE 694*  BUN 21*  CREATININE 1.69*  CALCIUM 9.8   GFR: Estimated Creatinine Clearance: 63.9 mL/min (by C-G formula based on SCr of 1.69 mg/dL (H)). Liver Function Tests: No results for input(s): AST, ALT, ALKPHOS, BILITOT, PROT, ALBUMIN in the last 168 hours. No results for input(s): LIPASE, AMYLASE in the last 168 hours. No results for input(s): AMMONIA in the last 168 hours. Coagulation Profile: No results for input(s): INR, PROTIME in the last 168 hours. Cardiac Enzymes: No results for input(s): CKTOTAL, CKMB, CKMBINDEX, TROPONINI in the last 168 hours. BNP (last 3 results) No results for input(s): PROBNP in the last 8760 hours. HbA1C: No results for input(s): HGBA1C in the last 72 hours. CBG:  Recent Labs Lab 07/12/16 1540 07/12/16 1655  GLUCAP 565* 406*   Lipid Profile: No results for input(s): CHOL, HDL, LDLCALC, TRIG, CHOLHDL, LDLDIRECT in the last 72 hours. Thyroid Function Tests: No results for input(s): TSH, T4TOTAL, FREET4, T3FREE, THYROIDAB in the last 72 hours. Anemia Panel: No results for input(s): VITAMINB12, FOLATE, FERRITIN, TIBC, IRON, RETICCTPCT in the last 72 hours. Urine analysis:    Component Value Date/Time   COLORURINE COLORLESS (A) 07/12/2016 1558   APPEARANCEUR CLEAR 07/12/2016 1558   LABSPEC 1.028  07/12/2016 1558   PHURINE 6.0 07/12/2016 1558   GLUCOSEU >=500 (A) 07/12/2016 1558   HGBUR NEGATIVE 07/12/2016 1558   BILIRUBINUR NEGATIVE 07/12/2016 1558   KETONESUR NEGATIVE 07/12/2016 1558   PROTEINUR NEGATIVE 07/12/2016 1558   UROBILINOGEN 0.2 01/01/2011 1630   NITRITE NEGATIVE 07/12/2016 1558   LEUKOCYTESUR NEGATIVE 07/12/2016 1558   Sepsis Labs: @LABRCNTIP (procalcitonin:4,lacticidven:4) ) Recent Results (from the past 240 hour(s))  Body Fluid Culture     Status: None   Collection Time: 07/14/16 11:10 AM  Result Value Ref Range Status   Culture Abundant STAPHYLOCOCCUS AUREUS  Final   Gram Stain Abundant  Final   Gram Stain WBC present-both PMN and Mononuclear  Final   Gram Stain No Organisms Seen  Final   Organism ID, Bacteria STAPHYLOCOCCUS AUREUS  Final    Comment: Rifampin and Gentamicin should not be used as single drugs for treatment of Staph infections. Critical Results Called to,Read Back By and Verified With: ASHLEY @ 11:19 AM 07/15/16 BY DWEEKS Report Faxed by Request This organism is presumed to be Clindamycin resistant based on detection of inducible Clindamycin resistance.       Susceptibility   Staphylococcus aureus -  (no method available)    OXACILLIN 0.5 Sensitive     CEFAZOLIN  Sensitive     GENTAMICIN <=0.5 Sensitive     CIPROFLOXACIN <=0.5 Sensitive     LEVOFLOXACIN 0.25 Sensitive     MOXIFLOXACIN <=0.25 Sensitive     TRIMETH/SULFA <=10 Sensitive     VANCOMYCIN 1 Sensitive     CLINDAMYCIN  Resistant     ERYTHROMYCIN >=8 Resistant     LINEZOLID 2 Sensitive     QUINUPRISTIN/DALF <=0.25 Sensitive     RIFAMPIN <=0.5 Sensitive     TETRACYCLINE <=1 Sensitive       UA not ordered  No results found for: HGBA1C  Estimated Creatinine Clearance: 63.9 mL/min (by C-G formula based on SCr of 1.69 mg/dL (H)).  BNP (last 3 results) No results for input(s): PROBNP in the last 8760 hours.   ECG REPORT Not obtained  There were no vitals filed for  this visit.   Cultures:  Component Value Date/Time   SDES URINE, CLEAN CATCH 06/03/2009 1212   SPECREQUEST NONE 06/03/2009 1212   CULT NO GROWTH 06/03/2009 1212   REPTSTATUS 06/04/2009 FINAL 06/03/2009 1212     Radiological Exams on Admission: No results found.  Chart has been reviewed    Assessment/Plan  50 y.o. male with medical history significant of substance abuse and Ekbom's delusional parasitosis, controlled diabetes mellitus , seizure disorder, schizophrenia being admitted for Septic olecranon bursitis of left elbow  Present on Admission: . Septic olecranon bursitis of left elbow and to wash out on Wednesday by orthopedics on file continue with IV antibiotics . 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 Continue to monitor. Hold off on Dilantin for now . Cellulitis -  admit per cellulitis protocol will       change to ancef given culture results            obtain blood cultures if febrile or septic   Plan for washout on Wednesday by orthopedics   DM 2  - order SSI, HgA1C, start Lantus 10 units diabetic care coordinator consult  Other plan as per orders.  DVT prophylaxis:     Hep [place]    Code Status:  FULL CODE   as per patient    Family Communication:   Family not  at  Bedside    Disposition Plan:     To home once workup is complete and patient is stable                        Diabetes coordinator       consulted                          Consults called: orthopedics aware   Admission status   inpatient       Level of care    medical floor           I have spent a total of 56 min on this admission   Yovanna Cogan 07/18/2016, 8:43 PM    [facility]  Pager [phone]   after 2 AM please page  floor coverage PA If 7AM-7PM, please contact the day team taking care of the patient  [email or web address]  Password [email or web address]

## 2016-07-18 NOTE — Progress Notes (Signed)
   Office Visit Note   Patient: Jeremiah Mcguire           Date of Birth: [birth year]           MRN: [id number] Visit Date: 07/18/2016              Requested by: [facility] [id number] [address], [place] [address] [facility]CP[facility]   Assessment & Plan: Visit Diagnoses:  1. Septic olecranon bursitis of left elbow     Plan: Patient is a poorly controlled diabetic with blood sugars in the 500-600 range. Were going to admit him to the hospitalist service for better control in anticipation for formal washout on Wednesday. Continue with Bactrim.  Follow-Up Instructions: Return for 2 week postop visit.   Orders:  No orders of the defined types were placed in this encounter.  No orders of the defined types were placed in this encounter.     Procedures: No procedures performed   Clinical Data: No additional findings.   Subjective: No chief complaint on file.   Patient follows up today for the check of his olecranon bursitis. The aspiration revealed sensitive staph aureus. He has been on Bactrim without any problems. He denies any constitutional symptoms.    Review of Systems   Objective: Vital Signs: There were no vitals taken for this visit.  Physical Exam Well-developed nourished acute distress alert 3 Ortho Exam Exam of the left elbow shows any redness and cellulitis. Drainage or fluctuance of the olecranon bursa. Specialty Comments:  No specialty comments available.  Imaging: No results found.   PMFS History: Patient Active Problem List   Diagnosis Date Noted  . Septic olecranon bursitis of left elbow 07/18/2016  . Diabetes mellitus ([place])   . Hemorrhoids, internal, with bleeding 08/30/2013  . Ekbom's delusional parasitosis ([place]) 12/21/2011  . Paresthesias 12/21/2011  . Seizure disorder ([place])   . PSYCHOSIS 11/02/2009  . SUBSTANCE ABUSE, MULTIPLE 11/02/2009  . PRURITUS ANI 11/02/2009  . DEPRESSION 09/21/2009  . HEADACHE,  CHRONIC 09/21/2009   Past Medical History:  Diagnosis Date  . Chronic headache   . Colon polyp, hyperplastic   . Depression   . Diabetes mellitus ([place])   . Ekbom's delusional parasitosis ([place]) 12/21/2011  . Gonorrhea   . Psychosis    Questioned schizophrenia  . Seizure disorder ([place])   . Substance abuse     Family History  Problem Relation Age of Onset  . Diabetes Mother     Past Surgical History:  Procedure Laterality Date  . COLONOSCOPY  09/04/2009   Dr. Silvano Rusk  . SKULL FRACTURE ELEVATION     Social History   Occupational History  . Unemployed Disabled   Social History Main Topics  . Smoking status: Never Smoker  . Smokeless tobacco: Never Used  . Alcohol use No  . Drug use: No  . Sexual activity: Not on file

## 2016-07-19 ENCOUNTER — Encounter (HOSPITAL_COMMUNITY): Payer: Self-pay | Admitting: *Deleted

## 2016-07-19 ENCOUNTER — Inpatient Hospital Stay (HOSPITAL_COMMUNITY): Admission: AD | Admit: 2016-07-19 | Payer: Medicaid Other | Source: Ambulatory Visit | Admitting: Orthopaedic Surgery

## 2016-07-19 LAB — GLUCOSE, CAPILLARY
GLUCOSE-CAPILLARY: 354 mg/dL — AB (ref 65–99)
GLUCOSE-CAPILLARY: 123 mg/dL — AB (ref 65–99)
Glucose-Capillary: 264 mg/dL — ABNORMAL HIGH (ref 65–99)
Glucose-Capillary: 191 mg/dL — ABNORMAL HIGH (ref 65–99)
Glucose-Capillary: 196 mg/dL — ABNORMAL HIGH (ref 65–99)

## 2016-07-19 LAB — PHOSPHORUS: Phosphorus: 3.5 mg/dL (ref 2.5–4.6)

## 2016-07-19 LAB — HEMOGLOBIN A1C
HEMOGLOBIN A1C: 14.8 % — AB (ref 4.8–5.6)
MEAN PLASMA GLUCOSE: 378 mg/dL

## 2016-07-19 LAB — PROTIME-INR
INR: 1
PROTHROMBIN TIME: 13.2 s (ref 11.4–15.2)

## 2016-07-19 LAB — COMPREHENSIVE METABOLIC PANEL
ALBUMIN: 3.2 g/dL — AB (ref 3.5–5.0)
ALK PHOS: 99 U/L (ref 38–126)
ALT: 27 U/L (ref 17–63)
ANION GAP: 7 (ref 5–15)
AST: 24 U/L (ref 15–41)
BILIRUBIN TOTAL: 0.6 mg/dL (ref 0.3–1.2)
BUN: 14 mg/dL (ref 6–20)
CALCIUM: 9.3 mg/dL (ref 8.9–10.3)
CO2: 28 mmol/L (ref 22–32)
Chloride: 97 mmol/L — ABNORMAL LOW (ref 101–111)
Creatinine, Ser: 1.42 mg/dL — ABNORMAL HIGH (ref 0.61–1.24)
GFR calc Af Amer: >60 mL/min (ref >60)
GFR, EST NON AFRICAN AMERICAN: 55 mL/min — AB (ref >60)
GLUCOSE: 276 mg/dL — AB (ref 65–99)
Potassium: 4.7 mmol/L (ref 3.5–5.1)
Sodium: 132 mmol/L — ABNORMAL LOW (ref 135–145)
TOTAL PROTEIN: 7 g/dL (ref 6.5–8.1)

## 2016-07-19 LAB — CBC
HCT: 36.7 % — ABNORMAL LOW (ref 39.0–52.0)
Hemoglobin: 12.2 g/dL — ABNORMAL LOW (ref 13.0–17.0)
MCH: 28.8 pg (ref 26.0–34.0)
MCHC: 33.2 g/dL (ref 30.0–36.0)
MCV: 86.8 fL (ref 78.0–100.0)
Platelets: 322 10*3/uL (ref 150–400)
RBC: 4.23 MIL/uL (ref 4.22–5.81)
RDW: 12.8 % (ref 11.5–15.5)
WBC: 7.9 10*3/uL (ref 4.0–10.5)

## 2016-07-19 LAB — TSH: TSH: 0.679 u[IU]/mL (ref 0.350–4.500)

## 2016-07-19 LAB — MAGNESIUM: MAGNESIUM: 2 mg/dL (ref 1.7–2.4)

## 2016-07-19 LAB — HIV ANTIBODY (ROUTINE TESTING W REFLEX): HIV SCREEN 4TH GENERATION: NONREACTIVE

## 2016-07-19 MED ORDER — INSULIN GLARGINE 100 UNIT/ML ~~LOC~~ SOLN: 15.0000 [IU] | Freq: Every day | SUBCUTANEOUS | Status: DC

## 2016-07-19 MED ORDER — INSULIN GLARGINE 100 UNIT/ML ~~LOC~~ SOLN
18.0000 [IU] | Freq: Every day | SUBCUTANEOUS | Status: DC
  Administered 2016-07-20 (×2): 18 [IU] via SUBCUTANEOUS
  Filled 2016-07-19 (×3): qty 0.18

## 2016-07-19 MED ORDER — INSULIN GLARGINE 100 UNIT/ML ~~LOC~~ SOLN
5.0000 [IU] | Freq: Once | SUBCUTANEOUS | Status: AC
  Administered 2016-07-19: 5 [IU] via SUBCUTANEOUS
  Filled 2016-07-19: qty 0.05

## 2016-07-19 MED ORDER — LIVING WELL WITH DIABETES BOOK
Freq: Once | Status: DC
  Filled 2016-07-19 (×2): qty 1

## 2016-07-19 MED ORDER — INSULIN ASPART 100 UNIT/ML ~~LOC~~ SOLN
6.0000 [IU] | Freq: Three times a day (TID) | SUBCUTANEOUS | Status: DC
  Administered 2016-07-19 – 2016-07-21 (×7): 6 [IU] via SUBCUTANEOUS

## 2016-07-19 NOTE — Progress Notes (Signed)
PROGRESS NOTE    PERKINS EDGE  [id number]  DOB: [birth year]  DOA: 07/18/2016 PCP: Jeremiah Mcguire Outpatient Specialists: orthopedics: Jeremiah Mcguire ID: Jeremiah Mcguire GI: Jeremiah Mcguire Card: [facility] course: Jeremiah Mcguire is a 50 y.o. male with medical history significant of substance abuse and Ekbom's delusional parasitosis, controlled diabetes mellitus , questionable seizure disorder, schizophrenia  Presented with 2 week hx of left elbow pain. He's been seen for this in emergency department as well as by orthopedics he reports intermittent fevers and chills. Patient states he initially had a scab that he picked off  Patient has been seen in emergency department on December 12 at that time his blood glucose was not controlled. He was seen by family medicine service and discharge to home on Keflex with follow-up. On December 14 patient was seen in our office by Dr. Erlinda Mcguire aspiration was done producing 6-7 mm of Jeremiah Mcguire Poss cultures later on grew MSSA patient empirically was started on Bactrim patient was seen and can today by orthopedics. The plan was to admit patient directly to hospitalist team with a plan to washout on Wednesday 07/20/16.    Assessment & Plan:   Cellulitis / Septic olecranon bursitis of left elbow - Planning OR I&D 12/20 Dr. Erlinda Mcguire ortho, continue Iv antibiotics for now, follow cultures.  Glycemic control!  Cellulitis protocol used, continue Ancef IV pending culture and sensitivity results.   Ekbom's delusional parasitosis - stable  History of schizophrenia - continue home medications for now, following, seems stable.   Uncontrolled type 2 diabetes insulin requiring with complications - Give additional basal insulin today, intensify basal bolus program today, check BS 5x per day.  Further adjust as needed.  A1c pending. CBG (last 3)   Recent Labs  07/18/16 2252 07/19/16 0802 07/19/16 1156  GLUCAP 333* 264* 354*   DVT prophylaxis: heparin Code  Status: full Family Communication: patient Disposition Plan: home   Consultants:  Jeremiah Mcguire (ortho)  Subjective: Pt resting comfortably, no complaints this morning  Objective: Vitals:   07/18/16 1745 07/18/16 2154 07/19/16 0019 07/19/16 0408  BP: 129/83 120/65 (!) 99/57 (!) 101/57  Pulse: 90 84 96 83  Resp:   18 18  Temp: 98.4 F (36.9 C) 97.9 F (36.6 C) 98.1 F (36.7 C) 97.8 F (36.6 C)  TempSrc: Oral Oral  Oral  SpO2: 98% 100% 96% 95%    Intake/Output Summary (Last 24 hours) at 07/19/16 1317 Last data filed at 07/19/16 0535  Gross per 24 hour  Intake             1015 ml  Output                0 ml  Net             1015 ml   There were no vitals filed for this visit.  Exam:  General exam: NAD, cooperative.  Respiratory system: Clear. No increased work of breathing. Cardiovascular system: S1 & S2 heard, RRR. No JVD, murmurs, gallops, clicks or pedal edema. Gastrointestinal system: Abdomen is nondistended, soft and nontender. Normal bowel sounds heard. Central nervous system: Alert and oriented. No focal neurological deficits. Extremities: cellulitis and abscess left elbow -see photos     Data Reviewed: Basic Metabolic Panel:  Recent Labs Lab 07/18/16 2004 07/19/16 0442  NA 132* 132*  K 5.1 4.7  CL 98* 97*  CO2 25 28  GLUCOSE 221* 276*  BUN 14 14  CREATININE 1.49* 1.42*  CALCIUM 10.0  9.3  MG  --  2.0  PHOS  --  3.5   Liver Function Tests:  Recent Labs Lab 07/18/16 2004 07/19/16 0442  AST 31 24  ALT 31 27  ALKPHOS 106 99  BILITOT 0.6 0.6  PROT 8.1 7.0  ALBUMIN 4.0 3.2*   No results for input(s): LIPASE, AMYLASE in the last 168 hours. No results for input(s): AMMONIA in the last 168 hours. CBC:  Recent Labs Lab 07/18/16 2004 07/19/16 0442  WBC 8.7 7.9  NEUTROABS 4.7  --   HGB 13.4 12.2*  HCT 40.2 36.7*  MCV 87.8 86.8  PLT 272 322   Cardiac Enzymes: No results for input(s): CKTOTAL, CKMB, CKMBINDEX, TROPONINI in the last 168  hours. CBG (last 3)   Recent Labs  07/18/16 2252 07/19/16 0802 07/19/16 1156  GLUCAP 333* 264* 354*   Recent Results (from the past 240 hour(s))  Body Fluid Culture     Status: None   Collection Time: 07/14/16 11:10 AM  Result Value Ref Range Status   Culture Abundant STAPHYLOCOCCUS AUREUS  Final   Gram Stain Abundant  Final   Gram Stain WBC present-both PMN and Mononuclear  Final   Gram Stain No Organisms Seen  Final   Organism ID, Bacteria STAPHYLOCOCCUS AUREUS  Final    Comment: Rifampin and Gentamicin should not be used as single drugs for treatment of Staph infections. Critical Results Called to,Read Back By and Verified With: ASHLEY @ 11:19 AM 07/15/16 BY DWEEKS Report Faxed by Request This organism is presumed to be Clindamycin resistant based on detection of inducible Clindamycin resistance.       Susceptibility   Staphylococcus aureus -  (no method available)    OXACILLIN 0.5 Sensitive     CEFAZOLIN  Sensitive     GENTAMICIN <=0.5 Sensitive     CIPROFLOXACIN <=0.5 Sensitive     LEVOFLOXACIN 0.25 Sensitive     MOXIFLOXACIN <=0.25 Sensitive     TRIMETH/SULFA <=10 Sensitive     VANCOMYCIN 1 Sensitive     CLINDAMYCIN  Resistant     ERYTHROMYCIN >=8 Resistant     LINEZOLID 2 Sensitive     QUINUPRISTIN/DALF <=0.25 Sensitive     RIFAMPIN <=0.5 Sensitive     TETRACYCLINE <=1 Sensitive     Studies: No results found.   Scheduled Meds: . ARIPiprazole  30 mg Oral Daily  . atorvastatin  20 mg Oral q1800  . benztropine  1 mg Oral BID  .  ceFAZolin (ANCEF) IV  2 g Intravenous Q8H  . heparin  5,000 Units Subcutaneous Q8H  . insulin aspart  0-15 Units Subcutaneous TID WC  . insulin aspart  0-5 Units Subcutaneous QHS  . insulin aspart  6 Units Subcutaneous TID WC  . insulin glargine  18 Units Subcutaneous QHS  . insulin glargine  5 Units Subcutaneous Once  . living well with diabetes book   Does not apply Once  . QUEtiapine  400 mg Oral BID  . senna  1 tablet  Oral BID   Continuous Infusions:  Active Problems:   Ekbom's delusional parasitosis ([place])   Seizure disorder ([place])   Diabetes mellitus ([place])   Septic olecranon bursitis of left elbow   Cellulitis  Time spent:   Irwin Brakeman, MD, FAAFP [facility] Pager [phone]  If 7PM-7AM, please contact night-coverage [email or web address] Password [email or web address] 07/19/2016, 1:17 PM    LOS: 1 day

## 2016-07-19 NOTE — Progress Notes (Signed)
Evaluate patient at bedside.  Exam is stable.  Plan is for formal I&D tomorrow in OR . Appreciate assistance from hospitalist with medical management.  Azucena Cecil, MD [place] [phone] 1:13 PM

## 2016-07-19 NOTE — Progress Notes (Addendum)
Inpatient Diabetes Program Recommendations  AACE/ADA: New Consensus Statement on Inpatient Glycemic Control (2015)  Target Ranges:  Prepandial:   less than 140 mg/dL      Peak postprandial:   less than 180 mg/dL (1-2 hours)      Critically ill patients:  140 - 180 mg/dL   Lab Results  Component Value Date   GLUCAP 264 (H) 07/19/2016   Results for BOEN, MALATESTA (MRN [id number]) as of 07/19/2016 09:32  Ref. Range 07/18/2016 20:47 07/18/2016 22:52 07/19/2016 08:02  Glucose-Capillary Latest Ref Range: 65 - 99 mg/dL 346 (H) 333 (H) 264 (H)   A1C in process - will follow  Review of Glycemic Control  Diabetes history:     DM, Creatinine=1.42, GFR=55, substance abuse, obesity Outpatient Diabetes medications:     Janumet 50-1000 mg daily (getting samples from MD) Current orders for Inpatient glycemic control:     Novolog 0-15 units TIDAC and 0-5 units QHS, Lantus 10 units QHS  Inpatient Diabetes Program Recommendations:     Please consider Novolog 3 units TIDAC meal coverage if patient eats > 50% of meal.     May want to consider increasing to Lantus 15 units QHS, if continues with current CBG trends (Current Lantus dose is 10% of 104 Kg weight).  Note: Spoke with patient regarding self-management of diabetes  1.  Taught patient the following (teach back attempted - patient unable):  Medications (what these are, why taking, when taking, how taking, common S.E.'s)  Basic pathophysiology of DM  CBG monitoring, A1C  Carb modified diet  2.  Identified barriers and facilitators to self-management goals:  Patient unable to state what home medications are, even after reviewing with patient what these were, patient unable to teach back any of information taught by this RN  3.  Identified support systems:  None identified by patient  Thank you,  Windy Carina, RN, BSN Diabetes Coordinator Inpatient Diabetes Program [phone] (Team Pager)

## 2016-07-20 ENCOUNTER — Inpatient Hospital Stay (HOSPITAL_COMMUNITY): Payer: Medicaid Other | Admitting: Certified Registered Nurse Anesthetist

## 2016-07-20 ENCOUNTER — Encounter (HOSPITAL_COMMUNITY): Payer: Self-pay | Admitting: Surgery

## 2016-07-20 ENCOUNTER — Other Ambulatory Visit: Payer: Self-pay

## 2016-07-20 ENCOUNTER — Encounter (HOSPITAL_COMMUNITY)
Admission: AD | Disposition: A | Discharge status: Home / Self Care | Payer: Self-pay | Source: Ambulatory Visit | Attending: Family Medicine

## 2016-07-20 DIAGNOSIS — M7022 Olecranon bursitis, left elbow: Secondary | ICD-10-CM

## 2016-07-20 HISTORY — PX: OLECRANON BURSECTOMY: SHX2097

## 2016-07-20 LAB — GLUCOSE, CAPILLARY
GLUCOSE-CAPILLARY: 220 mg/dL — AB (ref 65–99)
GLUCOSE-CAPILLARY: 226 mg/dL — AB (ref 65–99)
GLUCOSE-CAPILLARY: 167 mg/dL — AB (ref 65–99)
GLUCOSE-CAPILLARY: 145 mg/dL — AB (ref 65–99)
GLUCOSE-CAPILLARY: 150 mg/dL — AB (ref 65–99)
GLUCOSE-CAPILLARY: 137 mg/dL — AB (ref 65–99)
GLUCOSE-CAPILLARY: 134 mg/dL — AB (ref 65–99)
GLUCOSE-CAPILLARY: 244 mg/dL — AB (ref 65–99)

## 2016-07-20 LAB — SURGICAL PCR SCREEN
MRSA, PCR: NEGATIVE
STAPHYLOCOCCUS AUREUS: NEGATIVE

## 2016-07-20 SURGERY — BURSECTOMY, ELBOW
Anesthesia: General | Laterality: Left

## 2016-07-20 MED ORDER — PROPOFOL 10 MG/ML IV BOLUS
INTRAVENOUS | Status: DC | PRN
  Administered 2016-07-20: 200 mg via INTRAVENOUS

## 2016-07-20 MED ORDER — KETOROLAC TROMETHAMINE 30 MG/ML IJ SOLN: 30.0000 mg | Freq: Once | INTRAMUSCULAR | Status: DC | PRN

## 2016-07-20 MED ORDER — FENTANYL CITRATE (PF) 100 MCG/2ML IJ SOLN
INTRAMUSCULAR | Status: DC | PRN
  Administered 2016-07-20 (×2): 50 ug via INTRAVENOUS

## 2016-07-20 MED ORDER — ONDANSETRON HCL 4 MG/2ML IJ SOLN
INTRAMUSCULAR | Status: DC | PRN
  Administered 2016-07-20: 4 mg via INTRAVENOUS

## 2016-07-20 MED ORDER — HYDROMORPHONE HCL 1 MG/ML IJ SOLN
0.2500 mg | INTRAMUSCULAR | Status: DC | PRN
  Administered 2016-07-20 (×4): 0.5 mg via INTRAVENOUS

## 2016-07-20 MED ORDER — LACTATED RINGERS IV SOLN
INTRAVENOUS | Status: DC
  Administered 2016-07-20: 14:00:00 via INTRAVENOUS

## 2016-07-20 MED ORDER — SODIUM CHLORIDE 0.9 % IR SOLN
Status: DC | PRN
  Administered 2016-07-20: 6000 mL

## 2016-07-20 MED ORDER — HYDROMORPHONE HCL 2 MG/ML IJ SOLN
INTRAMUSCULAR | Status: AC
  Filled 2016-07-20: qty 1

## 2016-07-20 MED ORDER — LIDOCAINE HCL (CARDIAC) 20 MG/ML IV SOLN
INTRAVENOUS | Status: DC | PRN
  Administered 2016-07-20: 80 mg via INTRAVENOUS

## 2016-07-20 MED ORDER — CEFAZOLIN SODIUM-DEXTROSE 2-4 GM/100ML-% IV SOLN: 2.0000 g | INTRAVENOUS | Status: DC

## 2016-07-20 MED ORDER — PROMETHAZINE HCL 25 MG/ML IJ SOLN: 6.2500 mg | INTRAMUSCULAR | Status: DC | PRN

## 2016-07-20 MED ORDER — PHENYLEPHRINE 40 MCG/ML (10ML) SYRINGE FOR IV PUSH (FOR BLOOD PRESSURE SUPPORT)
PREFILLED_SYRINGE | INTRAVENOUS | Status: DC | PRN
  Administered 2016-07-20: 80 ug via INTRAVENOUS

## 2016-07-20 MED ORDER — MIDAZOLAM HCL 5 MG/5ML IJ SOLN
INTRAMUSCULAR | Status: DC | PRN
  Administered 2016-07-20: 2 mg via INTRAVENOUS

## 2016-07-20 MED ORDER — LACTATED RINGERS IV SOLN
INTRAVENOUS | Status: DC | PRN
  Administered 2016-07-20: 14:00:00 via INTRAVENOUS

## 2016-07-20 SURGICAL SUPPLY — 52 items
BNDG CMPR 9X4 STRL LF SNTH (GAUZE/BANDAGES/DRESSINGS)
BNDG COHESIVE 4X5 TAN STRL (GAUZE/BANDAGES/DRESSINGS) ×2 IMPLANT
BNDG ESMARK 4X9 LF (GAUZE/BANDAGES/DRESSINGS) IMPLANT
CANISTER SUCTION 1500CC (MISCELLANEOUS) IMPLANT
CLOSURE WOUND 1/2 X4 (GAUZE/BANDAGES/DRESSINGS)
COVER SURGICAL LIGHT HANDLE (MISCELLANEOUS) ×3 IMPLANT
CUFF TOURNIQUET SINGLE 18IN (TOURNIQUET CUFF) ×3 IMPLANT
CUFF TOURNIQUET SINGLE 24IN (TOURNIQUET CUFF) IMPLANT
DRAIN PENROSE 1/4X12 LTX STRL (WOUND CARE) ×2 IMPLANT
DRAPE C-ARM 42X72 X-RAY (DRAPES) ×3 IMPLANT
DRAPE IMP U-DRAPE 54X76 (DRAPES) ×3 IMPLANT
DRAPE INCISE IOBAN 66X45 STRL (DRAPES) ×3 IMPLANT
DRAPE U-SHAPE 47X51 STRL (DRAPES) ×3 IMPLANT
DRSG TEGADERM 4X4.75 (GAUZE/BANDAGES/DRESSINGS) ×4 IMPLANT
ELECT CAUTERY BLADE 6.4 (BLADE) ×3 IMPLANT
ELECT REM PT RETURN 9FT ADLT (ELECTROSURGICAL) ×3
ELECTRODE REM PT RTRN 9FT ADLT (ELECTROSURGICAL) ×1 IMPLANT
FACESHIELD WRAPAROUND (MASK) IMPLANT
FACESHIELD WRAPAROUND OR TEAM (MASK) ×1 IMPLANT
GAUZE SPONGE 4X4 12PLY STRL (GAUZE/BANDAGES/DRESSINGS) ×3 IMPLANT
GAUZE XEROFORM 1X8 LF (GAUZE/BANDAGES/DRESSINGS) ×1 IMPLANT
GAUZE XEROFORM 5X9 LF (GAUZE/BANDAGES/DRESSINGS) ×1 IMPLANT
GLOVE SKINSENSE NS SZ7.5 (GLOVE) ×8
GLOVE SKINSENSE STRL SZ7.5 (GLOVE) ×4 IMPLANT
GOWN STRL REIN XL XLG (GOWN DISPOSABLE) ×6 IMPLANT
KIT BASIN OR (CUSTOM PROCEDURE TRAY) ×3 IMPLANT
KIT ROOM TURNOVER OR (KITS) ×3 IMPLANT
MANIFOLD NEPTUNE II (INSTRUMENTS) ×3 IMPLANT
NS IRRIG 1000ML POUR BTL (IV SOLUTION) ×3 IMPLANT
PACK SHOULDER (CUSTOM PROCEDURE TRAY) ×3 IMPLANT
PACK UNIVERSAL I (CUSTOM PROCEDURE TRAY) ×3 IMPLANT
PAD ARMBOARD 7.5X6 YLW CONV (MISCELLANEOUS) ×6 IMPLANT
PAD CAST 4YDX4 CTTN HI CHSV (CAST SUPPLIES) ×1 IMPLANT
PADDING CAST COTTON 4X4 STRL (CAST SUPPLIES) ×3
SET CYSTO W/LG BORE CLAMP LF (SET/KITS/TRAYS/PACK) ×2 IMPLANT
SLING ARM IMMOBILIZER LRG (SOFTGOODS) ×1 IMPLANT
SPONGE LAP 18X18 X RAY DECT (DISPOSABLE) ×2 IMPLANT
STAPLER VISISTAT 35W (STAPLE) IMPLANT
STRIP CLOSURE SKIN 1/2X4 (GAUZE/BANDAGES/DRESSINGS) IMPLANT
SUCTION FRAZIER HANDLE 10FR (MISCELLANEOUS)
SUCTION TUBE FRAZIER 10FR DISP (MISCELLANEOUS) IMPLANT
SUT ETHILON 2 0 FS 18 (SUTURE) ×4 IMPLANT
SUT ETHILON 3 0 PS 1 (SUTURE) ×2 IMPLANT
SUT VIC AB 0 CT1 27 (SUTURE)
SUT VIC AB 0 CT1 27XBRD ANBCTR (SUTURE) ×1 IMPLANT
SUT VIC AB 2-0 CT1 27 (SUTURE)
SUT VIC AB 2-0 CT1 TAPERPNT 27 (SUTURE) ×1 IMPLANT
SYR CONTROL 10ML LL (SYRINGE) IMPLANT
TOWEL OR 17X24 6PK STRL BLUE (TOWEL DISPOSABLE) IMPLANT
TOWEL OR 17X26 10 PK STRL BLUE (TOWEL DISPOSABLE) ×3 IMPLANT
UNDERPAD 30X30 (UNDERPADS AND DIAPERS) ×3 IMPLANT
WATER STERILE IRR 1000ML POUR (IV SOLUTION) ×1 IMPLANT

## 2016-07-20 NOTE — H&P (Signed)

## 2016-07-20 NOTE — Transfer of Care (Signed)
Immediate Anesthesia Transfer of Care Note  Patient: Jeremiah Mcguire  Procedure(s) Performed: Procedure(s): LEFT OLECRANON BURSECTOMY (Left)  Patient Location: PACU  Anesthesia Type:General  Level of Consciousness: awake, alert , oriented and patient cooperative  Airway & Oxygen Therapy: Patient Spontanous Breathing and Patient connected to face mask oxygen  Post-op Assessment: Report given to RN and Post -op Vital signs reviewed and stable  Post vital signs: Reviewed and stable  Last Vitals:  Vitals:   07/19/16 2123 07/20/16 0614  BP: 124/82 (!) 105/59  Pulse: (!) 104 86  Resp:  18  Temp: 36.4 C 36.2 C    Last Pain:  Vitals:   07/20/16 0614  TempSrc: Oral  PainSc:          Complications: No apparent anesthesia complications

## 2016-07-20 NOTE — Progress Notes (Signed)
  Progress Note   Date: 07/20/2016  Patient Name: Jeremiah Mcguire        MRN#: [id number]  Review of the patient's clinical findings supports the diagnosis of  Acute renal failure    Irwin Brakeman, MD

## 2016-07-20 NOTE — Anesthesia Procedure Notes (Signed)
Procedure Name: LMA Insertion Date/Time: 07/20/2016 2:50 PM Performed by: Everlean Cherry A Pre-anesthesia Checklist: Patient identified, Emergency Drugs available, Suction available and Patient being monitored Patient Re-evaluated:Patient Re-evaluated prior to inductionOxygen Delivery Method: Circle system utilized Preoxygenation: Pre-oxygenation with 100% oxygen Intubation Type: IV induction Ventilation: Mask ventilation without difficulty LMA: LMA inserted LMA Size: 4.0 Placement Confirmation: positive ETCO2 and breath sounds checked- equal and bilateral Tube secured with: Tape Dental Injury: Teeth and Oropharynx as per pre-operative assessment

## 2016-07-20 NOTE — Op Note (Signed)
   Date of Surgery: 07/20/2016  INDICATIONS: Mr. Schiefer is a 50 y.o.-year-old male with a left septic olecranon bursitis;  The patient did consent to the procedure after discussion of the risks and benefits.  PREOPERATIVE DIAGNOSIS: Left septic olecranon bursitis  POSTOPERATIVE DIAGNOSIS: Same.  PROCEDURE: Excision of left olecranon bursa  SURGEON: N. Eduard Roux, M.D.  ASSIST: April Green, RNFA.  ANESTHESIA:  general  IV FLUIDS AND URINE: See anesthesia.  ESTIMATED BLOOD LOSS: minimal mL.  IMPLANTS: none  DRAINS: penrose  COMPLICATIONS: None.  DESCRIPTION OF PROCEDURE: The patient was brought to the operating room and placed supine on the operating table.  The patient had been signed prior to the procedure and this was documented. The patient had the anesthesia placed by the anesthesiologist.  A time-out was performed to confirm that this was the correct patient, site, side and location. The patient did receive antibiotics prior to the incision and was re-dosed during the procedure as needed at indicated intervals.  A tourniquet was placed.  The patient had the operative extremity prepped and draped in the standard surgical fashion.    A posterior incision directly over the olecranon bursa was made.  Dissection was taken down to the bursa.  The bursa was then fully excised using a rongeur.  There was no frank pus but appeared to be a partially treated abscess.  I then thoroughly irrigated the wound.  A 1/4 inch penrose was placed.  The skin was closed with 2.0 nylon.  Sterile dressings were applied.  Patient tolerated procedure well.  POSTOPERATIVE PLAN: Will remove penrose drain in 2 days and will monitor for clinical improvement.  Anticipate being able to go home Friday with po antibiotics.  Azucena Cecil, MD [place] 3:26 PM

## 2016-07-20 NOTE — Progress Notes (Signed)
PROGRESS NOTE    Jeremiah Mcguire  [id number]  DOB: Apr 11, 1966  DOA: 07/18/2016 PCP: [facility]: orthopedics: Erlinda Hong ID: [name] GI: Carlean Purl Card: [facility] course: Jeremiah Mcguire is a 50 y.o. Jeremiah with medical history significant of substance abuse and Ekbom's delusional parasitosis, controlled diabetes mellitus , questionable seizure disorder, schizophrenia  Presented with 2 week hx of left elbow pain. He's been seen for this in emergency department as well as by orthopedics he reports intermittent fevers and chills. Patient states he initially had a scab that he picked off  Patient has been seen in emergency department on December 12 at that time his blood glucose was not controlled. He was seen by family medicine service and discharge to home on Keflex with follow-up. On December 14 patient was seen in our office by Dr. Erlinda Hong aspiration was done producing 6-7 mm of Pilar Plate Poss cultures later on grew MSSA patient empirically was started on Bactrim patient was seen and can today by orthopedics. The plan was to admit patient directly to hospitalist team with a plan to washout on Wednesday 07/20/16.    Assessment & Plan:   Cellulitis / Septic olecranon bursitis of left elbow - Planning OR I&D 12/20 Dr. Erlinda Hong ortho, continue Iv antibiotics for now, follow cultures.  Glycemic control!  Cellulitis protocol used, continue Ancef IV pending culture and sensitivity results.   Ekbom's delusional parasitosis - stable  History of schizophrenia - continue home medications for now, following, seems stable.   Uncontrolled type 2 diabetes insulin requiring with complications - Give additional basal insulin today, intensify basal bolus program today, check BS 5x per day.  Further adjust as needed.  A1c pending. CBG (last 3)   Recent Labs  07/20/16 1406 07/20/16 1538 07/20/16 1657  GLUCAP 150* 137* 134*   DVT prophylaxis: heparin Code  Status: full Family Communication: patient Disposition Plan: home   Consultants:  Xu (ortho)  Subjective: Pt resting comfortably, no complaints this morning  Objective: Vitals:   07/20/16 1615 07/20/16 1626 07/20/16 1630 07/20/16 1645  BP: 116/74 (!) 155/78 116/84   Pulse: 71 69 71 78  Resp: 11  12 13   Temp:      TempSrc:      SpO2: 99%  98% 97%  Weight:      Height:        Intake/Output Summary (Last 24 hours) at 07/20/16 1719 Last data filed at 07/20/16 1539  Gross per 24 hour  Intake              350 ml  Output               25 ml  Net              325 ml   Filed Weights   07/20/16 1000  Weight: 104.3 kg (230 lb)    Exam:  General exam: NAD, cooperative.  Respiratory system: Clear. No increased work of breathing. Cardiovascular system: S1 & S2 heard, RRR. No JVD, murmurs, gallops, clicks or pedal edema. Gastrointestinal system: Abdomen is nondistended, soft and nontender. Normal bowel sounds heard. Central nervous system: Alert and oriented. No focal neurological deficits. Extremities: cellulitis and abscess left elbow slightly improved appearance.  Data Reviewed: Basic Metabolic Panel:  Recent Labs Lab 07/18/16 2004 07/19/16 0442  NA 132* 132*  K 5.1 4.7  CL 98* 97*  CO2 25 28  GLUCOSE 221* 276*  BUN 14 14  CREATININE 1.49*  1.42*  CALCIUM 10.0 9.3  MG  --  2.0  PHOS  --  3.5   Liver Function Tests:  Recent Labs Lab 07/18/16 2004 07/19/16 0442  AST 31 24  ALT 31 27  ALKPHOS 106 99  BILITOT 0.6 0.6  PROT 8.1 7.0  ALBUMIN 4.0 3.2*   No results for input(s): LIPASE, AMYLASE in the last 168 hours. No results for input(s): AMMONIA in the last 168 hours. CBC:  Recent Labs Lab 07/18/16 2004 07/19/16 0442  WBC 8.7 7.9  NEUTROABS 4.7  --   HGB 13.4 12.2*  HCT 40.2 36.7*  MCV 87.8 86.8  PLT 272 322   Cardiac Enzymes: No results for input(s): CKTOTAL, CKMB, CKMBINDEX, TROPONINI in the last 168 hours. CBG (last 3)   Recent Labs   07/20/16 1406 07/20/16 1538 07/20/16 1657  GLUCAP 150* 137* 134*   Recent Results (from the past 240 hour(s))  Body Fluid Culture     Status: None   Collection Time: 07/14/16 11:10 AM  Result Value Ref Range Status   Culture Abundant STAPHYLOCOCCUS AUREUS  Final   Gram Stain Abundant  Final   Gram Stain WBC present-both PMN and Mononuclear  Final   Gram Stain No Organisms Seen  Final   Organism ID, Bacteria STAPHYLOCOCCUS AUREUS  Final    Comment: Rifampin and Gentamicin should not be used as single drugs for treatment of Staph infections. Critical Results Called to,Read Back By and Verified With: ASHLEY @ 11:19 AM 07/15/16 BY DWEEKS Report Faxed by Request This organism is presumed to be Clindamycin resistant based on detection of inducible Clindamycin resistance.       Susceptibility   Staphylococcus aureus -  (no method available)    OXACILLIN 0.5 Sensitive     CEFAZOLIN  Sensitive     GENTAMICIN <=0.5 Sensitive     CIPROFLOXACIN <=0.5 Sensitive     LEVOFLOXACIN 0.25 Sensitive     MOXIFLOXACIN <=0.25 Sensitive     TRIMETH/SULFA <=10 Sensitive     VANCOMYCIN 1 Sensitive     CLINDAMYCIN  Resistant     ERYTHROMYCIN >=8 Resistant     LINEZOLID 2 Sensitive     QUINUPRISTIN/DALF <=0.25 Sensitive     RIFAMPIN <=0.5 Sensitive     TETRACYCLINE <=1 Sensitive   Culture, blood (routine x 2)     Status: None (Preliminary result)   Collection Time: 07/18/16  7:53 PM  Result Value Ref Range Status   Specimen Description BLOOD RIGHT ANTECUBITAL  Final   Special Requests BOTTLES DRAWN AEROBIC AND ANAEROBIC 5CC EA  Final   Culture NO GROWTH 2 DAYS  Final   Report Status PENDING  Incomplete  Culture, blood (routine x 2)     Status: None (Preliminary result)   Collection Time: 07/18/16  8:00 PM  Result Value Ref Range Status   Specimen Description BLOOD RIGHT ANTECUBITAL  Final   Special Requests BOTTLES DRAWN AEROBIC ONLY 5CC  Final   Culture NO GROWTH 2 DAYS  Final   Report  Status PENDING  Incomplete  Surgical PCR screen     Status: None   Collection Time: 07/20/16  4:32 AM  Result Value Ref Range Status   MRSA, PCR NEGATIVE NEGATIVE Final   Staphylococcus aureus NEGATIVE NEGATIVE Final    Comment:        The Xpert SA Assay (FDA approved for NASAL specimens in patients over 9 years of age), is one component of a comprehensive surveillance program.  Test performance  has been validated by [facility] for patients greater than or equal to 47 year old. It is not intended to diagnose infection nor to guide or monitor treatment.     Studies: No results found.   Scheduled Meds: . ARIPiprazole  30 mg Oral Daily  . atorvastatin  20 mg Oral q1800  . benztropine  1 mg Oral BID  .  ceFAZolin (ANCEF) IV  2 g Intravenous Q8H  . HYDROmorphone      . insulin aspart  0-15 Units Subcutaneous TID WC  . insulin aspart  0-5 Units Subcutaneous QHS  . insulin aspart  6 Units Subcutaneous TID WC  . insulin glargine  18 Units Subcutaneous QHS  . living well with diabetes book   Does not apply Once  . QUEtiapine  400 mg Oral BID  . senna  1 tablet Oral BID   Continuous Infusions: . lactated ringers 10 mL/hr at 07/20/16 1416    Active Problems:   Ekbom's delusional parasitosis ([place])   Seizure disorder (HCC)   Diabetes mellitus ([place])   Olecranon bursitis of left elbow   Cellulitis  Time spent:   Irwin Brakeman, MD, FAAFP [facility] Pager [phone]  If 7PM-7AM, please contact night-coverage [email or web address] Password [email or web address] 07/20/2016, 5:19 PM    LOS: 2 days

## 2016-07-20 NOTE — Anesthesia Preprocedure Evaluation (Signed)
Anesthesia Evaluation  Patient identified by MRN, date of birth, ID band Patient awake    Reviewed: Allergy & Precautions, NPO status , Patient's Chart, lab work & pertinent test results  Airway Mallampati: II  TM Distance: >3 FB Neck ROM: Full    Dental no notable dental hx.    Pulmonary neg pulmonary ROS,    Pulmonary exam normal breath sounds clear to auscultation       Cardiovascular negative cardio ROS Normal cardiovascular exam Rhythm:Regular Rate:Normal     Neuro/Psych Seizures -,  Schizophrenia    GI/Hepatic negative GI ROS, Neg liver ROS,   Endo/Other  diabetes  Renal/GU negative Renal ROS  negative genitourinary   Musculoskeletal negative musculoskeletal ROS (+)   Abdominal   Peds negative pediatric ROS (+)  Hematology negative hematology ROS (+)   Anesthesia Other Findings   Reproductive/Obstetrics negative OB ROS                             Anesthesia Physical Anesthesia Plan  ASA: III  Anesthesia Plan: General   Post-op Pain Management:    Induction: Intravenous  Airway Management Planned: Oral ETT  Additional Equipment:   Intra-op Plan:   Post-operative Plan: Extubation in OR  Informed Consent: I have reviewed the patients History and Physical, chart, labs and discussed the procedure including the risks, benefits and alternatives for the proposed anesthesia with the patient or authorized representative who has indicated his/her understanding and acceptance.   Dental advisory given  Plan Discussed with: CRNA and Surgeon  Anesthesia Plan Comments:         Anesthesia Quick Evaluation

## 2016-07-20 NOTE — Progress Notes (Signed)
Nutrition Education Note  RD consulted for diet education regarding uncontrolled DM.   Lab Results  Component Value Date   HGBA1C 14.8 (H) 07/19/2016   Reviewed DM coordinator note from 07/19/16; per note pt was unable to successfully teachback information presented at visit.   Attempted to educate pt x 3, however, either unavailable, receiving nursing care, or down for procedure at time of visit. RD will follow-up tomorrow, 07/21/16, for education.   Lindzie Boxx A. Jimmye Norman, RD, LDN, CDE Pager: [phone] After hours Pager: [phone]

## 2016-07-20 NOTE — Anesthesia Postprocedure Evaluation (Signed)
Anesthesia Post Note  Patient: Jeremiah Mcguire  Procedure(s) Performed: Procedure(s) (LRB): LEFT OLECRANON BURSECTOMY (Left)  Patient location during evaluation: PACU Anesthesia Type: General Level of consciousness: awake and alert Pain management: pain level controlled Vital Signs Assessment: post-procedure vital signs reviewed and stable Respiratory status: spontaneous breathing, nonlabored ventilation, respiratory function stable and patient connected to nasal cannula oxygen Cardiovascular status: blood pressure returned to baseline and stable Postop Assessment: no signs of nausea or vomiting Anesthetic complications: no       Last Vitals:  Vitals:   07/20/16 1630 07/20/16 1645  BP: 116/84   Pulse: 71 78  Resp: 12 13  Temp:      Last Pain:  Vitals:   07/20/16 1626  TempSrc: Oral  PainSc:                  Taquan Bralley,JAMES TERRILL

## 2016-07-21 ENCOUNTER — Encounter (HOSPITAL_COMMUNITY): Payer: Self-pay | Admitting: Orthopaedic Surgery

## 2016-07-21 LAB — COMPREHENSIVE METABOLIC PANEL
ALK PHOS: 99 U/L (ref 38–126)
ALT: 28 U/L (ref 17–63)
ANION GAP: 6 (ref 5–15)
AST: 39 U/L (ref 15–41)
Albumin: 3.4 g/dL — ABNORMAL LOW (ref 3.5–5.0)
BUN: 16 mg/dL (ref 6–20)
CALCIUM: 9.2 mg/dL (ref 8.9–10.3)
CO2: 28 mmol/L (ref 22–32)
CREATININE: 1.6 mg/dL — AB (ref 0.61–1.24)
Chloride: 97 mmol/L — ABNORMAL LOW (ref 101–111)
GFR, EST AFRICAN AMERICAN: 56 mL/min — AB (ref >60)
GFR, EST NON AFRICAN AMERICAN: 48 mL/min — AB (ref >60)
Glucose, Bld: 190 mg/dL — ABNORMAL HIGH (ref 65–99)
Potassium: 5.1 mmol/L (ref 3.5–5.1)
SODIUM: 131 mmol/L — AB (ref 135–145)
TOTAL PROTEIN: 7.7 g/dL (ref 6.5–8.1)
Total Bilirubin: 0.5 mg/dL (ref 0.3–1.2)

## 2016-07-21 LAB — GLUCOSE, CAPILLARY
GLUCOSE-CAPILLARY: 200 mg/dL — AB (ref 65–99)
GLUCOSE-CAPILLARY: 265 mg/dL — AB (ref 65–99)
Glucose-Capillary: 189 mg/dL — ABNORMAL HIGH (ref 65–99)
Glucose-Capillary: 203 mg/dL — ABNORMAL HIGH (ref 65–99)
Glucose-Capillary: 153 mg/dL — ABNORMAL HIGH (ref 65–99)

## 2016-07-21 LAB — CBC
HEMATOCRIT: 40.7 % (ref 39.0–52.0)
HEMOGLOBIN: 13.6 g/dL (ref 13.0–17.0)
MCH: 29.5 pg (ref 26.0–34.0)
MCHC: 33.4 g/dL (ref 30.0–36.0)
MCV: 88.3 fL (ref 78.0–100.0)
Platelets: 352 10*3/uL (ref 150–400)
RBC: 4.61 MIL/uL (ref 4.22–5.81)
RDW: 12.9 % (ref 11.5–15.5)
WBC: 8.5 10*3/uL (ref 4.0–10.5)

## 2016-07-21 MED ORDER — SULFAMETHOXAZOLE-TRIMETHOPRIM 800-160 MG PO TABS
1.0000 | ORAL_TABLET | Freq: Two times a day (BID) | ORAL | Status: DC
  Administered 2016-07-21 – 2016-07-22 (×3): 1 via ORAL
  Filled 2016-07-21 (×3): qty 1

## 2016-07-21 MED ORDER — INSULIN GLARGINE 100 UNIT/ML ~~LOC~~ SOLN
20.0000 [IU] | Freq: Every day | SUBCUTANEOUS | Status: DC
  Administered 2016-07-21: 20 [IU] via SUBCUTANEOUS
  Filled 2016-07-21: qty 0.2

## 2016-07-21 MED ORDER — INSULIN ASPART 100 UNIT/ML ~~LOC~~ SOLN
8.0000 [IU] | Freq: Three times a day (TID) | SUBCUTANEOUS | Status: DC
  Administered 2016-07-21 – 2016-07-22 (×2): 8 [IU] via SUBCUTANEOUS

## 2016-07-21 MED ORDER — SODIUM CHLORIDE 0.9 % IV BOLUS (SEPSIS)
500.0000 mL | Freq: Once | INTRAVENOUS | Status: AC
  Administered 2016-07-21: 500 mL via INTRAVENOUS

## 2016-07-21 MED ORDER — INSULIN STARTER KIT- PEN NEEDLES (ENGLISH)
1.0000 | Freq: Once | Status: DC
  Filled 2016-07-21: qty 1

## 2016-07-21 NOTE — Care Management Note (Signed)
Case Management Note  Patient Details  Name: TRELL GETZ MRN: [id number] Date of Birth: 04/11/1966  Subjective/Objective:  Admitted with L elbow cellulitis,  history of substance abuse and Ekbom's delusional parasitosis, controlled diabetes mellitus , questionable seizure disorder, schizophrenia.                  PCP; [facility] [place], FNP-BC   Action/Plan:   Expected Discharge Date:   07/22/2016            Expected Discharge Plan:  [place]  In-House Referral:     Discharge planning Services  CM Consult  Post Acute Care Choice:    Choice offered to:  Patient  DME Arranged:    DME Agency:     HH Arranged:  RN, Disease Management [place] Agency:  [place]  Status of Service:  In process, will continue to follow  If discussed at Long Length of Stay Meetings, dates discussed:    Additional Comments:  Sharin Mons, RN 07/21/2016, 11:07 AM

## 2016-07-21 NOTE — Progress Notes (Signed)
PROGRESS NOTE    Jeremiah Mcguire  [id number]  DOB: April 12, 1966  DOA: 07/18/2016 PCP: [facility]: orthopedics: Erlinda Hong ID: [name] GI: Carlean Purl Card: [facility] course: Jeremiah Mcguire is a 50 y.o. male with medical history significant of substance abuse and Ekbom's delusional parasitosis, controlled diabetes mellitus , questionable seizure disorder, schizophrenia  Presented with 2 week hx of left elbow pain. He's been seen for this in emergency department as well as by orthopedics he reports intermittent fevers and chills. Patient states he initially had a scab that he picked off  Patient has been seen in emergency department on December 12 at that time his blood glucose was not controlled. He was seen by family medicine service and discharge to home on Keflex with follow-up. On December 14 patient was seen in our office by Dr. Erlinda Hong aspiration was done producing 6-7 mm of Pilar Plate Poss cultures later on grew MSSA patient empirically was started on Bactrim patient was seen and can today by orthopedics. The plan was to admit patient directly to hospitalist team with a plan to washout on Wednesday 07/20/16.    Assessment & Plan:   Cellulitis / Septic olecranon bursitis of left elbow - s/p OR I&D 12/20 Dr. Erlinda Hong ortho, follow cultures.  Glycemic control!  Cellulitis protocol used,   Ekbom's delusional parasitosis - stable  History of schizophrenia - continue home medications for now, following, seems stable.   Uncontrolled type 2 diabetes insulin requiring with complications - Give additional basal insulin today, intensify basal bolus program today, check BS 5x per day.  Further adjust as needed.  A1c >14% and he will not have meaning recovery without being discharged on insulin.  We will need to teach him how to give insulin in hospital so that he can be safely discharged tomorrow with some knowledge about giving himself insulin injections  and monitoring blood sugar 4-5 times per day. Insulin kit pen needle ordered. DM coordinator following.  He has a PCP to follow up with, he says.  He says he is willing to learn and to give insulin at home.   CBG (last 3)   Recent Labs  07/21/16 0336 07/21/16 0539 07/21/16 1206  GLUCAP 189* 200* 265*   DVT prophylaxis: heparin Code Status: full Family Communication: patient Disposition Plan: home tomorrow on oral antibiotics and insulin with close outpatient follow up with PCP and orthopedics  Consultants:  Xu (ortho)  Subjective: Pt resting comfortably, no complaints this morning  Objective: Vitals:   07/20/16 1900 07/20/16 2113 07/21/16 0541 07/21/16 1352  BP: 109/74 (!) 105/51 117/71 (!) 113/59  Pulse: 79 (!) 111 90 100  Resp:  '17 18 17  '$ Temp:  97.5 F (36.4 C) 97.9 F (36.6 C) 98.4 F (36.9 C)  TempSrc:  Oral Oral Oral  SpO2:  98% 98% 97%  Weight:      Height:        Intake/Output Summary (Last 24 hours) at 07/21/16 1357 Last data filed at 07/21/16 1005  Gross per 24 hour  Intake          1185.83 ml  Output               25 ml  Net          1160.83 ml   Filed Weights   07/20/16 1000  Weight: 104.3 kg (230 lb)    Exam:  General exam: NAD, cooperative.  Respiratory system: Clear. No increased work of  breathing. Cardiovascular system: S1 & S2 heard, RRR. No JVD, murmurs, gallops, clicks or pedal edema. Gastrointestinal system: Abdomen is nondistended, soft and nontender. Normal bowel sounds heard. Central nervous system: Alert and oriented. No focal neurological deficits. Extremities: cellulitis and abscess left elbow slightly improved appearance.  Data Reviewed: Basic Metabolic Panel:  Recent Labs Lab 07/18/16 2004 07/19/16 0442 07/21/16 0549  NA 132* 132* 131*  K 5.1 4.7 5.1  CL 98* 97* 97*  CO2 '25 28 28  '$ GLUCOSE 221* 276* 190*  BUN '14 14 16  '$ CREATININE 1.49* 1.42* 1.60*  CALCIUM 10.0 9.3 9.2  MG  --  2.0  --   PHOS  --  3.5  --     Liver Function Tests:  Recent Labs Lab 07/18/16 2004 07/19/16 0442 07/21/16 0549  AST 31 24 39  ALT '31 27 28  '$ ALKPHOS 106 99 99  BILITOT 0.6 0.6 0.5  PROT 8.1 7.0 7.7  ALBUMIN 4.0 3.2* 3.4*   No results for input(s): LIPASE, AMYLASE in the last 168 hours. No results for input(s): AMMONIA in the last 168 hours. CBC:  Recent Labs Lab 07/18/16 2004 07/19/16 0442 07/21/16 0549  WBC 8.7 7.9 8.5  NEUTROABS 4.7  --   --   HGB 13.4 12.2* 13.6  HCT 40.2 36.7* 40.7  MCV 87.8 86.8 88.3  PLT 272 322 352   Cardiac Enzymes: No results for input(s): CKTOTAL, CKMB, CKMBINDEX, TROPONINI in the last 168 hours. CBG (last 3)   Recent Labs  07/21/16 0336 07/21/16 0539 07/21/16 1206  GLUCAP 189* 200* 265*   Recent Results (from the past 240 hour(s))  Body Fluid Culture     Status: None   Collection Time: 07/14/16 11:10 AM  Result Value Ref Range Status   Culture Abundant STAPHYLOCOCCUS AUREUS  Final   Gram Stain Abundant  Final   Gram Stain WBC present-both PMN and Mononuclear  Final   Gram Stain No Organisms Seen  Final   Organism ID, Bacteria STAPHYLOCOCCUS AUREUS  Final    Comment: Rifampin and Gentamicin should not be used as single drugs for treatment of Staph infections. Critical Results Called to,Read Back By and Verified With: ASHLEY @ 11:19 AM 07/15/16 BY DWEEKS Report Faxed by Request This organism is presumed to be Clindamycin resistant based on detection of inducible Clindamycin resistance.       Susceptibility   Staphylococcus aureus -  (no method available)    OXACILLIN 0.5 Sensitive     CEFAZOLIN  Sensitive     GENTAMICIN <=0.5 Sensitive     CIPROFLOXACIN <=0.5 Sensitive     LEVOFLOXACIN 0.25 Sensitive     MOXIFLOXACIN <=0.25 Sensitive     TRIMETH/SULFA <=10 Sensitive     VANCOMYCIN 1 Sensitive     CLINDAMYCIN  Resistant     ERYTHROMYCIN >=8 Resistant     LINEZOLID 2 Sensitive     QUINUPRISTIN/DALF <=0.25 Sensitive     RIFAMPIN <=0.5 Sensitive      TETRACYCLINE <=1 Sensitive   Culture, blood (routine x 2)     Status: None (Preliminary result)   Collection Time: 07/18/16  7:53 PM  Result Value Ref Range Status   Specimen Description BLOOD RIGHT ANTECUBITAL  Final   Special Requests BOTTLES DRAWN AEROBIC AND ANAEROBIC 5CC EA  Final   Culture NO GROWTH 2 DAYS  Final   Report Status PENDING  Incomplete  Culture, blood (routine x 2)     Status: None (Preliminary result)   Collection Time:  07/18/16  8:00 PM  Result Value Ref Range Status   Specimen Description BLOOD RIGHT ANTECUBITAL  Final   Special Requests BOTTLES DRAWN AEROBIC ONLY 5CC  Final   Culture NO GROWTH 2 DAYS  Final   Report Status PENDING  Incomplete  Surgical PCR screen     Status: None   Collection Time: 07/20/16  4:32 AM  Result Value Ref Range Status   MRSA, PCR NEGATIVE NEGATIVE Final   Staphylococcus aureus NEGATIVE NEGATIVE Final    Comment:        The Xpert SA Assay (FDA approved for NASAL specimens in patients over 41 years of age), is one component of a comprehensive surveillance program.  Test performance has been validated by [facility] for patients greater than or equal to 72 year old. It is not intended to diagnose infection nor to guide or monitor treatment.     Studies: No results found.   Scheduled Meds: . ARIPiprazole  30 mg Oral Daily  . atorvastatin  20 mg Oral q1800  . benztropine  1 mg Oral BID  . insulin aspart  0-15 Units Subcutaneous TID WC  . insulin aspart  0-5 Units Subcutaneous QHS  . insulin aspart  8 Units Subcutaneous TID WC  . insulin glargine  20 Units Subcutaneous QHS  . insulin starter kit- pen needles  1 kit Other Once  . living well with diabetes book   Does not apply Once  . QUEtiapine  400 mg Oral BID  . senna  1 tablet Oral BID  . sulfamethoxazole-trimethoprim  1 tablet Oral Q12H   Continuous Infusions: . lactated ringers 10 mL/hr at 07/20/16 1416    Active Problems:   Ekbom's delusional parasitosis  ([place])   Seizure disorder (HCC)   Diabetes mellitus ([place])   Olecranon bursitis of left elbow   Cellulitis  Time spent:   Irwin Brakeman, MD, FAAFP [facility] Pager [phone]  If 7PM-7AM, please contact night-coverage [email or web address] Password [email or web address] 07/21/2016, 1:57 PM    LOS: 3 days

## 2016-07-21 NOTE — Progress Notes (Signed)
   Subjective:  Patient reports pain as mild.  No events.  Objective:   VITALS:   Vitals:   07/20/16 1816 07/20/16 1900 07/20/16 2113 07/21/16 0541  BP: 120/77 109/74 (!) 105/51 117/71  Pulse: 77 79 (!) 111 90  Resp:   17 18  Temp:   97.5 F (36.4 C) 97.9 F (36.6 C)  TempSrc:   Oral Oral  SpO2: 100%  98% 98%  Weight:      Height:       Incision c/d/i No purulence  Lab Results  Component Value Date   WBC 8.5 07/21/2016   HGB 13.6 07/21/2016   HCT 40.7 07/21/2016   MCV 88.3 07/21/2016   PLT 352 07/21/2016     Assessment/Plan:  1 Day Post-Op   - penrose removed, dressing changed - activity as tolerated, do not put pressure on elbow area - would recommend 2 weeks of po abx, bactrim vs cipro - f/u 2 weeks with me  Eduard Roux 07/21/2016, 7:54 AM [phone]

## 2016-07-21 NOTE — Plan of Care (Signed)
Problem: Food- and Nutrition-Related Knowledge Deficit (NB-1.1) Goal: Nutrition education Formal process to instruct or train a patient/client in a skill or to impart knowledge to help patients/clients voluntarily manage or modify food choices and eating behavior to maintain or improve health.  Outcome: Adequate for Discharge  RD consulted for nutrition education regarding diabetes.   Lab Results  Component Value Date   HGBA1C 14.8 (H) 07/19/2016   Spoke with pt at bedside, who reports that he has been trying to make efforts to improve glycemic control. Pt shares that he consumes 3 meals per day and has transitioned to baked proteins and vegetables. Pt shares that he is frustrated over portion control of carbohydrates and is afraid that he will consume too many.   Pt shares that he just learned to self test at his PCP office PTA, however, 'I've never done this myself". Pt was also able to tell this RD that he took an oral pill for DM control PTA, however, unable to identify which one (per DM coordinator note, pt takes Janumet 50-1000 daily, for which he receives samples from his PCP).   RD spent time with pt discussing DM diet basics, including consistent meal intake and plate method. Educated pt on ways he could transition from sugar sweetened beverages to lower calorie beverages Also discussed importance of self-management, including taking medications, self-testing and logging CBGS, and attending PCP appointments. Pt expressed concern of being able to administer insulin; assured pt that he will be taught all necessary skills upon discharge.   Noted that pt was easily distracted at times of interview and was difficult to redirect. Unsure how much information pt was able to retain. Pt would greatly benefit from outpatient diabetes education for reinforcement. RD to order.   RD provided "Carbohydrate Counting for People with Diabetes" handout from the Academy of [employer]etics. Discussed  different food groups and their effects on blood sugar, emphasizing carbohydrate-containing foods. Provided list of carbohydrates and recommended serving sizes of common foods.  Discussed importance of controlled and consistent carbohydrate intake throughout the day. Provided examples of ways to balance meals/snacks and encouraged intake of high-fiber, whole grain complex carbohydrates. Teach back method used.  Expect fair to poor compliance.  Body mass index is 31.19 kg/m. Pt meets criteria for obesity, class I based on current BMI.  Current diet order is Heart Healthy/ Carb Modified, patient is consuming approximately 100% of meals at this time. Labs and medications reviewed. No further nutrition interventions warranted at this time. RD contact information provided. If additional nutrition issues arise, please re-consult RD.  Carrel Leather A. Jimmye Norman, RD, LDN, CDE Pager: [phone] After hours Pager: [phone]

## 2016-07-22 DIAGNOSIS — E1122 Type 2 diabetes mellitus with diabetic chronic kidney disease: Secondary | ICD-10-CM

## 2016-07-22 DIAGNOSIS — N182 Chronic kidney disease, stage 2 (mild): Secondary | ICD-10-CM

## 2016-07-22 LAB — GLUCOSE, CAPILLARY
GLUCOSE-CAPILLARY: 189 mg/dL — AB (ref 65–99)
GLUCOSE-CAPILLARY: 184 mg/dL — AB (ref 65–99)
GLUCOSE-CAPILLARY: 203 mg/dL — AB (ref 65–99)
Glucose-Capillary: 189 mg/dL — ABNORMAL HIGH (ref 65–99)

## 2016-07-22 MED ORDER — SULFAMETHOXAZOLE-TRIMETHOPRIM 800-160 MG PO TABS: 1.0000 | ORAL_TABLET | Freq: Two times a day (BID) | ORAL | 0 refills | Status: AC

## 2016-07-22 MED ORDER — INSULIN PEN NEEDLE 31G X 5 MM MISC: 0 refills | Status: AC

## 2016-07-22 MED ORDER — GLUCAGON (RDNA) 1 MG IJ KIT: 1.0000 mg | PACK | Freq: Once | INTRAMUSCULAR | 0 refills | Status: AC | PRN

## 2016-07-22 MED ORDER — INSULIN GLARGINE 100 UNIT/ML SOLOSTAR PEN: 20.0000 [IU] | PEN_INJECTOR | Freq: Every day | SUBCUTANEOUS | 0 refills | Status: AC

## 2016-07-22 MED ORDER — INSULIN ASPART 100 UNIT/ML FLEXPEN: 8.0000 [IU] | PEN_INJECTOR | Freq: Three times a day (TID) | SUBCUTANEOUS | 0 refills | Status: AC

## 2016-07-22 MED ORDER — BLOOD GLUCOSE METER KIT: PACK | 0 refills | Status: AC

## 2016-07-22 NOTE — Progress Notes (Signed)
Patient discharge teaching given, including activity, diet, follow-up appoints, and medications. Patient verbalized understanding of all discharge instructions. IV access was d/c'd. Vitals are stable. Skin is intact except as charted in most recent assessments. Pt to be escorted out by NT, to be driven home by family. 

## 2016-07-22 NOTE — Discharge Summary (Signed)
Physician Discharge Summary  Jeremiah Mcguire ION:[id number] DOB: [birth year] DOA: 07/18/2016  PCP: [facility] Orthopedist: Dr. Erlinda Hong  Admit date: 07/18/2016 Discharge date: 07/22/2016  Admitted From: Home  Disposition:  Home   Recommendations for Outpatient Follow-up:  1. Follow up with PCP in 1 weeks 2. Monitor blood sugars 5 times per day and report to PCP 3. Follow up with orthopedics as scheduled  Discharge Condition: STABLE CODE STATUS: FULL  Diet recommendation: Heart Healthy / Carb Modified   Brief/Interim Summary: Hospital course: Jeremiah Wigley [name] a 50 y.o.malewith medical history significant of substance abuse and Ekbom's delusional parasitosis, controlled diabetes mellitus , questionable seizure disorder, schizophrenia Presented with 2 week hx of left elbow pain. He's been seen for this in emergency department as well as by orthopedics he reports intermittent fevers and chills.Patient states he initially had a scab that he picked off Patient has been seen in emergency department on December 12 at that time his blood glucose was not controlled. He was seen by family medicine service and discharge to home on Keflex with follow-up. On December 14 patient was seen in our office by Dr. Erlinda Hong aspiration was done producing 6-7 mm of Pilar Plate Poss cultures later on grew MSSA patient empirically was started on Bactrim patient was seen and can today by orthopedics. The plan was to admit patient directly to hospitalist team with a plan to washout on Wednesday 07/20/16.    Assessment & Plan: Staph Aureus abscess  Cellulitis / Septic olecranon bursitis of left elbow - abundant staph aureus noted on cultures, sensitive to bactrim DS, s/p I&D 12/20 Dr. Erlinda Hong ortho, continue Iv antibiotics for now, follow cultures.  Glycemic control!    Ekbom's delusional parasitosis - stable  History of schizophrenia - continue home medications for now, following, seems stable.    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  Follow up with PCP.   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  Further adjust as needed outpatient with PCP.  A1c>14%.  Pt was counseled in hospital and had diabetes education about giving insulin at home. Will use insulin pens. Viewed diabetes videos.  Ordered glucagon emergency kit for home.  Referral to outpatient diabetes education. Emphasized the importance of close outpatient follow up with PCP for ongoing diabetes management. The patient verbalized understanding.   CBG (last 3)   Recent Labs  07/21/16 2216 07/22/16 0410 07/22/16 0659  GLUCAP 153* 189* 189*   DVT prophylaxis: heparin Code Status: full Family Communication: patient Disposition Plan: home   Consultants:  Xu (ortho)   Discharge Diagnoses:  Active Problems:   Ekbom's delusional parasitosis ([place])   Seizure disorder ([place])   Diabetes mellitus ([place])   Olecranon bursitis of left elbow   Cellulitis  Discharge Instructions  Discharge Instructions    Amb Referral to Nutrition and Diabetic E    Complete by:  As directed    Pt with uncontrolled type 2 DM (Hgb A1c > 14), new to insulin. Pt would greatly benefit from education and reinforcement of DMSET.   Discharge instructions    Complete by:  As directed    Check blood sugars 5 times per day.  Follow up with your primary care physician within 1 week. Follow up with orthopedics Dr. Erlinda Hong as scheduled   Increase activity slowly    Complete by:  As directed      Allergies as of 07/22/2016      Reactions   Motrin [ibuprofen] Hives, Nausea And Vomiting   Citric Acid Rash   Tomato Rash      Medication List    STOP taking these medications   cephALEXin 500 MG capsule Commonly known as:  KEFLEX   JANUMET 50-1000 MG tablet Generic  drug:  sitaGLIPtin-metformin   phenytoin 100 MG ER capsule Commonly known as:  DILANTIN     TAKE these medications   ARIPiprazole 30 MG tablet Commonly known as:  ABILIFY Take 30 mg by mouth daily.   atorvastatin 20 MG tablet Commonly known as:  LIPITOR Take 20 mg by mouth daily.   benztropine 1 MG tablet Commonly known as:  COGENTIN Take 1 mg by mouth 2 (two) times daily.   blood glucose meter kit and supplies Dispense based on patient and insurance preference. Use up to four times daily as directed. (Dx: E11.9)   cholecalciferol 1000 units tablet Commonly known as:  VITAMIN D Take 1,000 Units by mouth daily.   Fish Oil 1000 MG Cpdr Take 1,000 mg by mouth daily.   glucagon 1 MG injection Commonly known as:  GLUCAGON EMERGENCY Inject 1 mg into the vein once as needed.   insulin aspart 100 UNIT/ML FlexPen Commonly known as:  NOVOLOG FLEXPEN Inject 8 Units into the skin 3 (three) times daily with meals.   Insulin Glargine 100 UNIT/ML Solostar Pen Commonly known as:  LANTUS SOLOSTAR Inject 20 Units into the skin daily at 10 pm.   Insulin Pen Needle 31G X 5 MM Misc Use as directed with insulin pens   QUEtiapine 400 MG tablet Commonly known as:  SEROQUEL Take 400 mg by mouth 2 (two) times daily.   sulfamethoxazole-trimethoprim 800-160 MG tablet Commonly known as:  BACTRIM DS,SEPTRA DS Take 1 tablet by mouth 2 (two) times daily.   traMADol 50 MG tablet Commonly known as:  ULTRAM Take 1 tablet (50 mg total) by mouth every 6 (six) hours as needed.      Follow-up Information    Eduard Roux, MD Follow up in 2 week(s).   Specialty:  Orthopedic Surgery Contact information: [place] [address] [place]. Schedule an appointment as soon as possible for a visit in 1 week(s).   Why:  Hospital Follow Up, Diabetes Check Contact information: [place]  [id number] [phone]          Allergies  Allergen Reactions  . Motrin [Ibuprofen] Hives and Nausea And Vomiting  . Citric Acid Rash  . Tomato Rash    Procedures/Studies: Dg Elbow Complete Left  Result Date: 07/12/2016 CLINICAL DATA:  Pain and swelling, no known injury, initial encounter EXAM: LEFT ELBOW - COMPLETE 3+ VIEW COMPARISON:  05/09/2011 FINDINGS: Mild degenerative changes are noted in the radial humeral joint. Spurring is noted from the olecranon stable from the prior exam. Soft tissue swelling is seen likely related to an underlying bursitis. IMPRESSION: No acute bony abnormality is noted. Chronic changes are seen as described. Changes suggestive of olecranon bursitis. Electronically Signed   By: Inez Catalina M.D.   On: 07/12/2016 11:20      Subjective: Pt motivated to go home and has been learning to give insulin and check blood sugars and plans to see PCP in 1 week.   Discharge Exam: Vitals:   07/21/16  2127 07/22/16 0441  BP: 103/64 109/62  Pulse: (!) 102 97  Resp: 18 18  Temp: 98.6 F (37 C) 98.2 F (36.8 C)   Vitals:   07/21/16 0541 07/21/16 1352 07/21/16 2127 07/22/16 0441  BP: 117/71 (!) 113/59 103/64 109/62  Pulse: 90 100 (!) 102 97  Resp: '18 17 18 18  '$ Temp: 97.9 F (36.6 C) 98.4 F (36.9 C) 98.6 F (37 C) 98.2 F (36.8 C)  TempSrc: Oral Oral Oral Oral  SpO2: 98% 97% 99% 100%  Weight:      Height:       General: Pt is alert, awake, not in acute distress Cardiovascular: RRR, S1/S2 +, no rubs, no gallops Respiratory: CTA bilaterally, no wheezing, no rhonchi Abdominal: Soft, NT, ND, bowel sounds + Extremities: left elbow wound healing well, wound c/d/i  The results of significant diagnostics from this hospitalization (including imaging, microbiology, ancillary and laboratory) are listed below for reference.     Microbiology: Recent Results (from the past 240 hour(s))  Body Fluid Culture     Status: None   Collection Time: 07/14/16 11:10 AM   Result Value Ref Range Status   Culture Abundant STAPHYLOCOCCUS AUREUS  Final   Gram Stain Abundant  Final   Gram Stain WBC present-both PMN and Mononuclear  Final   Gram Stain No Organisms Seen  Final   Organism ID, Bacteria STAPHYLOCOCCUS AUREUS  Final    Comment: Rifampin and Gentamicin should not be used as single drugs for treatment of Staph infections. Critical Results Called to,Read Back By and Verified With: ASHLEY @ 11:19 AM 07/15/16 BY DWEEKS Report Faxed by Request This organism is presumed to be Clindamycin resistant based on detection of inducible Clindamycin resistance.       Susceptibility   Staphylococcus aureus -  (no method available)    OXACILLIN 0.5 Sensitive     CEFAZOLIN  Sensitive     GENTAMICIN <=0.5 Sensitive     CIPROFLOXACIN <=0.5 Sensitive     LEVOFLOXACIN 0.25 Sensitive     MOXIFLOXACIN <=0.25 Sensitive     TRIMETH/SULFA <=10 Sensitive     VANCOMYCIN 1 Sensitive     CLINDAMYCIN  Resistant     ERYTHROMYCIN >=8 Resistant     LINEZOLID 2 Sensitive     QUINUPRISTIN/DALF <=0.25 Sensitive     RIFAMPIN <=0.5 Sensitive     TETRACYCLINE <=1 Sensitive   Culture, blood (routine x 2)     Status: None (Preliminary result)   Collection Time: 07/18/16  7:53 PM  Result Value Ref Range Status   Specimen Description BLOOD RIGHT ANTECUBITAL  Final   Special Requests BOTTLES DRAWN AEROBIC AND ANAEROBIC 5CC EA  Final   Culture NO GROWTH 3 DAYS  Final   Report Status PENDING  Incomplete  Culture, blood (routine x 2)     Status: None (Preliminary result)   Collection Time: 07/18/16  8:00 PM  Result Value Ref Range Status   Specimen Description BLOOD RIGHT ANTECUBITAL  Final   Special Requests BOTTLES DRAWN AEROBIC ONLY 5CC  Final   Culture NO GROWTH 3 DAYS  Final   Report Status PENDING  Incomplete  Surgical PCR screen     Status: None   Collection Time: 07/20/16  4:32 AM  Result Value Ref Range Status   MRSA, PCR NEGATIVE NEGATIVE Final   Staphylococcus  aureus NEGATIVE NEGATIVE Final    Comment:        The Xpert SA Assay (FDA approved for NASAL specimens in  patients over 22 years of age), is one component of a comprehensive surveillance program.  Test performance has been validated by [facility] for patients greater than or equal to 52 year old. It is not intended to diagnose infection nor to guide or monitor treatment.      Labs: BNP (last 3 results) No results for input(s): BNP in the last 8760 hours. Basic Metabolic Panel:  Recent Labs Lab 07/18/16 2004 07/19/16 0442 07/21/16 0549  NA 132* 132* 131*  K 5.1 4.7 5.1  CL 98* 97* 97*  CO2 '25 28 28  '$ GLUCOSE 221* 276* 190*  BUN '14 14 16  '$ CREATININE 1.49* 1.42* 1.60*  CALCIUM 10.0 9.3 9.2  MG  --  2.0  --   PHOS  --  3.5  --    Liver Function Tests:  Recent Labs Lab 07/18/16 2004 07/19/16 0442 07/21/16 0549  AST 31 24 39  ALT '31 27 28  '$ ALKPHOS 106 99 99  BILITOT 0.6 0.6 0.5  PROT 8.1 7.0 7.7  ALBUMIN 4.0 3.2* 3.4*   No results for input(s): LIPASE, AMYLASE in the last 168 hours. No results for input(s): AMMONIA in the last 168 hours. CBC:  Recent Labs Lab 07/18/16 2004 07/19/16 0442 07/21/16 0549  WBC 8.7 7.9 8.5  NEUTROABS 4.7  --   --   HGB 13.4 12.2* 13.6  HCT 40.2 36.7* 40.7  MCV 87.8 86.8 88.3  PLT 272 322 352   Cardiac Enzymes: No results for input(s): CKTOTAL, CKMB, CKMBINDEX, TROPONINI in the last 168 hours. BNP: Invalid input(s): POCBNP CBG:  Recent Labs Lab 07/21/16 1206 07/21/16 1707 07/21/16 2216 07/22/16 0410 07/22/16 0659  GLUCAP 265* 203* 153* 189* 189*   D-Dimer No results for input(s): DDIMER in the last 72 hours. Hgb A1c No results for input(s): HGBA1C in the last 72 hours. Lipid Profile No results for input(s): CHOL, HDL, LDLCALC, TRIG, CHOLHDL, LDLDIRECT in the last 72 hours. Thyroid function studies No results for input(s): TSH, T4TOTAL, T3FREE, THYROIDAB in the last 72 hours.  Invalid input(s):  FREET3 Anemia work up No results for input(s): VITAMINB12, FOLATE, FERRITIN, TIBC, IRON, RETICCTPCT in the last 72 hours. Urinalysis    Component Value Date/Time   COLORURINE COLORLESS (A) 07/12/2016 1558   APPEARANCEUR CLEAR 07/12/2016 1558   LABSPEC 1.028 07/12/2016 1558   PHURINE 6.0 07/12/2016 1558   GLUCOSEU >=500 (A) 07/12/2016 1558   HGBUR NEGATIVE 07/12/2016 1558   BILIRUBINUR NEGATIVE 07/12/2016 1558   KETONESUR NEGATIVE 07/12/2016 1558   PROTEINUR NEGATIVE 07/12/2016 1558   UROBILINOGEN 0.2 01/01/2011 1630   NITRITE NEGATIVE 07/12/2016 1558   LEUKOCYTESUR NEGATIVE 07/12/2016 1558   Sepsis Labs Invalid input(s): PROCALCITONIN,  WBC,  LACTICIDVEN Microbiology Recent Results (from the past 240 hour(s))  Body Fluid Culture     Status: None   Collection Time: 07/14/16 11:10 AM  Result Value Ref Range Status   Culture Abundant STAPHYLOCOCCUS AUREUS  Final   Gram Stain Abundant  Final   Gram Stain WBC present-both PMN and Mononuclear  Final   Gram Stain No Organisms Seen  Final   Organism ID, Bacteria STAPHYLOCOCCUS AUREUS  Final    Comment: Rifampin and Gentamicin should not be used as single drugs for treatment of Staph infections. Critical Results Called to,Read Back By and Verified With: ASHLEY @ 11:19 AM 07/15/16 BY DWEEKS Report Faxed by Request This organism is presumed to be Clindamycin resistant based on detection of inducible Clindamycin resistance.  Susceptibility   Staphylococcus aureus -  (no method available)    OXACILLIN 0.5 Sensitive     CEFAZOLIN  Sensitive     GENTAMICIN <=0.5 Sensitive     CIPROFLOXACIN <=0.5 Sensitive     LEVOFLOXACIN 0.25 Sensitive     MOXIFLOXACIN <=0.25 Sensitive     TRIMETH/SULFA <=10 Sensitive     VANCOMYCIN 1 Sensitive     CLINDAMYCIN  Resistant     ERYTHROMYCIN >=8 Resistant     LINEZOLID 2 Sensitive     QUINUPRISTIN/DALF <=0.25 Sensitive     RIFAMPIN <=0.5 Sensitive     TETRACYCLINE <=1 Sensitive   Culture,  blood (routine x 2)     Status: None (Preliminary result)   Collection Time: 07/18/16  7:53 PM  Result Value Ref Range Status   Specimen Description BLOOD RIGHT ANTECUBITAL  Final   Special Requests BOTTLES DRAWN AEROBIC AND ANAEROBIC 5CC EA  Final   Culture NO GROWTH 3 DAYS  Final   Report Status PENDING  Incomplete  Culture, blood (routine x 2)     Status: None (Preliminary result)   Collection Time: 07/18/16  8:00 PM  Result Value Ref Range Status   Specimen Description BLOOD RIGHT ANTECUBITAL  Final   Special Requests BOTTLES DRAWN AEROBIC ONLY 5CC  Final   Culture NO GROWTH 3 DAYS  Final   Report Status PENDING  Incomplete  Surgical PCR screen     Status: None   Collection Time: 07/20/16  4:32 AM  Result Value Ref Range Status   MRSA, PCR NEGATIVE NEGATIVE Final   Staphylococcus aureus NEGATIVE NEGATIVE Final    Comment:        The Xpert SA Assay (FDA approved for NASAL specimens in patients over 28 years of age), is one component of a comprehensive surveillance program.  Test performance has been validated by [facility] for patients greater than or equal to 51 year old. It is not intended to diagnose infection nor to guide or monitor treatment.    Time coordinating discharge:31 minutes  SIGNED:  Irwin Brakeman, MD  [facility] 07/22/2016, 7:26 AM Pager   If 7PM-7AM, please contact night-coverage [email or web address] Password [email or web address]

## 2016-07-23 LAB — CULTURE, BLOOD (ROUTINE X 2)
CULTURE: NO GROWTH
Culture: NO GROWTH

## 2016-07-27 ENCOUNTER — Telehealth (INDEPENDENT_AMBULATORY_CARE_PROVIDER_SITE_OTHER): Payer: Self-pay | Admitting: Orthopaedic Surgery

## 2016-07-27 NOTE — Telephone Encounter (Signed)
When is patient supposed to some back in for surgery followup?   cb# [phone]

## 2016-07-27 NOTE — Telephone Encounter (Signed)
2 week po?

## 2016-07-28 NOTE — Telephone Encounter (Signed)
Yes 2 weeks

## 2016-07-29 NOTE — Telephone Encounter (Signed)
Can you make patient follow up appt please?

## 2016-08-02 ENCOUNTER — Ambulatory Visit (INDEPENDENT_AMBULATORY_CARE_PROVIDER_SITE_OTHER): Payer: Medicaid Other | Admitting: Orthopaedic Surgery

## 2016-08-02 ENCOUNTER — Encounter (INDEPENDENT_AMBULATORY_CARE_PROVIDER_SITE_OTHER): Payer: Self-pay | Admitting: Orthopaedic Surgery

## 2016-08-02 DIAGNOSIS — M71122 Other infective bursitis, left elbow: Secondary | ICD-10-CM

## 2016-08-02 NOTE — Progress Notes (Signed)
Patient is 2 weeks status post left olecranon bursectomy for infection. He is doing well. He has no swelling. There is no pain. Physical exam shows well-healed surgical scar. There is no residual cellulitis or signs of infection. He has full range of motion. There is no fluctuance. Sutures were removed. He has finished his course of antibiotics. I'll plan on seeing him back as needed.

## 2016-08-03 NOTE — Progress Notes (Signed)
  Progress Note   Date: 08/03/2016  Patient Name: Jeremiah Mcguire        MRN#: [id number]  Cellulitis / abscess is due to uncontrolled diabetes mellitus.    Irwin Brakeman, MD

## 2018-05-22 ENCOUNTER — Other Ambulatory Visit: Payer: Self-pay

## 2018-05-22 ENCOUNTER — Emergency Department (HOSPITAL_COMMUNITY)
Admission: EM | Admit: 2018-05-22 | Discharge: 2018-05-22 | Disposition: A | Discharge status: Home / Self Care | Payer: Medicaid Other | Attending: Emergency Medicine | Admitting: Emergency Medicine

## 2018-05-22 ENCOUNTER — Encounter (HOSPITAL_COMMUNITY): Payer: Self-pay | Admitting: Emergency Medicine

## 2018-05-22 ENCOUNTER — Ambulatory Visit (HOSPITAL_COMMUNITY)
Admission: RE | Admit: 2018-05-22 | Discharge: 2018-05-22 | Disposition: A | Discharge status: Home / Self Care | Payer: Medicaid Other | Source: Ambulatory Visit | Attending: Emergency Medicine | Admitting: Emergency Medicine

## 2018-05-22 DIAGNOSIS — E119 Type 2 diabetes mellitus without complications: Secondary | ICD-10-CM | POA: Insufficient documentation

## 2018-05-22 DIAGNOSIS — R2231 Localized swelling, mass and lump, right upper limb: Secondary | ICD-10-CM

## 2018-05-22 DIAGNOSIS — M7989 Other specified soft tissue disorders: Secondary | ICD-10-CM | POA: Diagnosis present

## 2018-05-22 DIAGNOSIS — Z79899 Other long term (current) drug therapy: Secondary | ICD-10-CM | POA: Diagnosis not present

## 2018-05-22 MED ORDER — DOXYCYCLINE HYCLATE 100 MG PO CAPS: 100.0000 mg | ORAL_CAPSULE | Freq: Two times a day (BID) | ORAL | 0 refills | Status: AC

## 2018-05-22 MED ORDER — CEPHALEXIN 500 MG PO CAPS: 500.0000 mg | ORAL_CAPSULE | Freq: Four times a day (QID) | ORAL | 0 refills | Status: AC

## 2018-05-22 NOTE — ED Provider Notes (Signed)
[facility] DEPT Provider Note   CSN: [id number] Arrival date & time: 05/22/18  0145     History   Chief Complaint Chief Complaint  Patient presents with  . Arm Pain    HPI Jeremiah Mcguire is a 52 y.o. male.  HPI 52 year old male with history of seizure disorder, past substance abuse, diabetes comes in with main complaint of right-sided arm swelling.  Patient states that he noted swelling of his right upper extremity about 2 days ago.  He has no associated pain, numbness, tingling.  Patient also denies any chest pain, dib, fevers or chills.  There is no similar symptoms in the past  Pt has no hx of PE, DVT and denies any exogenous hormone (testosterone / estrogen) use, long distance travels or surgery in the past 6 weeks, active cancer, recent immobilization.   Past Medical History:  Diagnosis Date  . Bipolar disorder ([place])   . Colon polyp, hyperplastic   . Depression   . 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 Gonorrhea   . Migraine    "probably weekly" (07/18/2016)  . Psychosis ([place])    Questioned schizophrenia  . Seizure disorder ([place])    "in the past; don't know what related to; don't know when last one was" (07/18/2016)  . Septic bursitis of elbow, left 07/18/2016 admission  . Substance abuse ([place])    pt denies this hx on 07/18/2016  . Type II diabetes mellitus [facility])     Patient Active Problem List   Diagnosis Date Noted  . Septic olecranon bursitis of left elbow 07/18/2016  . Cellulitis 07/18/2016  . Diabetes mellitus ([place])   . Hemorrhoids, internal, with bleeding 08/30/2013  . 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 Paresthesias 12/21/2011  . Seizure disorder ([place])   . PSYCHOSIS 11/02/2009  . SUBSTANCE ABUSE, MULTIPLE 11/02/2009  . PRURITUS ANI 11/02/2009  . DEPRESSION 09/21/2009  . HEADACHE, CHRONIC 09/21/2009    Past Surgical History:  Procedure Laterality Date  . COLONOSCOPY  09/04/2009   Dr. Silvano Rusk  . ESOPHAGOGASTRODUODENOSCOPY    . OLECRANON BURSECTOMY Left 07/20/2016   Procedure: LEFT OLECRANON BURSECTOMY;  Surgeon: Leandrew Koyanagi, MD;  Location: [facility];  Service: Orthopedics;  Laterality: Left;  . SKULL FRACTURE ELEVATION  1990s        Home Medications    Prior to Admission medications   Medication Sig Start Date End Date Taking? Authorizing Provider  ARIPiprazole (ABILIFY) 30 MG tablet Take 30 mg by mouth daily.    [provider]  atorvastatin (LIPITOR) 20 MG tablet Take 20 mg by mouth daily.    [provider]  benztropine (COGENTIN) 1 MG tablet Take 1 mg by mouth 2 (two) times daily.      [provider]  blood glucose meter kit and supplies Dispense based on patient and insurance preference. Use up to four times daily as directed. (Dx: E11.9) 07/22/16   Laye, Clanford L, MD  cephALEXin (KEFLEX) 500 MG capsule Take 1 capsule (500 mg total) by mouth 4 (four) times daily. 05/22/18   Varney Biles, MD  cholecalciferol (VITAMIN D) 1000 units tablet Take 1,000 Units by mouth daily.    [provider]  doxycycline (VIBRAMYCIN) 100 MG capsule Take 1 capsule (100 mg total) by mouth 2 (two) times daily. 05/22/18   Varney Biles, MD  glucagon (GLUCAGON EMERGENCY) 1 MG injection Inject 1 mg into the vein once as needed. 07/22/16   Murlean Iba, MD  insulin aspart (NOVOLOG FLEXPEN) 100 UNIT/ML FlexPen Inject 8 Units into the skin 3 (three) times daily with meals. 07/22/16   Leazer, Clanford L, MD  Insulin Glargine (LANTUS SOLOSTAR) 100 UNIT/ML Solostar Pen Inject 20 Units into the skin daily at 10 pm. 07/22/16   Murlean Iba, MD  Insulin Pen Needle 31G X 5 MM MISC Use as directed with insulin pens 07/22/16   [place], Clanford L, MD  Omega-3 Fatty Acids (FISH OIL) 1000 MG CPDR Take 1,000 mg by mouth daily.    [provider]  QUEtiapine (SEROQUEL) 400 MG tablet Take 400 mg by mouth 2 (two) times daily.      [provider]  traMADol (ULTRAM) 50 MG tablet Take 1 tablet (50 mg total) by mouth every 6 (six) hours as needed. 07/12/16   Dorie Rank, MD    Family History Family History  Problem Relation Age of Onset  . Diabetes Mother   . Hypertension Other   . CAD Neg Hx   . Cancer Neg Hx     Social History Social History   Tobacco Use  . Smoking status: Never Smoker  . Smokeless tobacco: Never Used  Substance Use Topics  . Alcohol use: No  . Drug use: No     Allergies   Motrin [ibuprofen]; Citric acid; and Tomato   Review of Systems Review of Systems  Constitutional: Positive for activity change.  Respiratory: Negative for shortness of breath.   Cardiovascular: Negative for chest pain.  Musculoskeletal: Negative for arthralgias and myalgias.  Skin: Negative for rash.  Hematological: Does not bruise/bleed easily.     Physical Exam Updated Vital Signs BP (!) 143/80 (BP Location: Left Arm)   Pulse 81   Temp 98 F (36.7 C) (Oral)   Resp 18   Ht '5\' 11"'$  (1.803 m)   Wt 108.9 kg   SpO2 100%   BMI 33.47 kg/m   Physical Exam  Constitutional: He appears well-developed.  HENT:  Head: Atraumatic.  No facial asymmetry  Neck: Neck supple.  Cardiovascular: Normal rate.  Pulmonary/Chest: Effort normal.  Musculoskeletal: He exhibits edema. He exhibits no tenderness.  Right upper extremity slightly edematous compared to the left side.  There is no range of motion compromise over the wrist, elbow.  Patient able to pronate and supinate without any difficulty.  The skin is slightly warm compared to contralateral side.  Range of motion of the shoulder is also normal.  Neurological: He is alert.  Skin: Skin is warm. There is erythema.  Nursing note and vitals reviewed.    ED Treatments / Results  Labs (all labs ordered are listed, but only abnormal results are displayed) Labs Reviewed - No data to display  EKG None  Radiology No results  found.  Procedures Procedures (including critical care time)  Medications Ordered in ED Medications - No data to display   Initial Impression / Assessment and Plan / ED Course  I have reviewed the triage vital signs and the nursing notes.  Pertinent labs & imaging results that were available during my care of the patient were reviewed by me and considered in my medical decision making (see chart for details).     52 year old male comes in with chief complaint of right-sided arm swelling.  He noted the swelling 2 days ago.  Patient does not have any pain in the range of motion of the shoulder is normal.  There is no facial asymmetry and patient does not have any DVT or  PE risk factors.  There is no clear evidence of cellulitis either.  Our plan is to get an ultrasound DVT of the right upper extremity.  Wells score is greater than 1. If the ultrasound is normal then patient will fill the prescription provided for cellulitis and follow-up with his PCP in 7 to 10 days.   Final Clinical Impressions(s) / ED Diagnoses   Final diagnoses:  Localized swelling of right upper extremity    ED Discharge Orders         Ordered    UE VENOUS DUPLEX     05/22/18 0502    doxycycline (VIBRAMYCIN) 100 MG capsule  2 times daily     05/22/18 0503    cephALEXin (KEFLEX) 500 MG capsule  4 times daily     05/22/18 0503           Varney Biles, MD 05/22/18 [phone]

## 2018-05-22 NOTE — Discharge Instructions (Signed)
We signed the ER for the upper extremity swelling. Please get the ultrasound completed at [facility] as instructed in the discharge instructions.  If the ultrasound does not show blood clot, then take the medications for possible skin infection causing the swelling. Follow-up with your doctor in 7 to 10 days.

## 2018-05-22 NOTE — ED Triage Notes (Signed)
Pt reports swelling to right forearm that started yesterday. Unsure if it may be due to an insect bite.

## 2018-05-22 NOTE — Progress Notes (Signed)
*  Preliminary Results* Right upper extremity venous duplex completed. Right upper extremity is negative for deep and superficial vein thrombosis.  05/22/2018 10:08 AM Jeremiah Mcguire

## 2019-12-03 ENCOUNTER — Encounter: Payer: Self-pay | Admitting: Internal Medicine

## 2020-01-08 ENCOUNTER — Telehealth: Payer: Self-pay | Admitting: *Deleted

## 2020-01-08 NOTE — Telephone Encounter (Signed)
Pt NOS to PV.  Attempted to reach pt- mailbox not set up.

## 2020-01-08 NOTE — Telephone Encounter (Signed)
Pt called back. He was r/s to 6/16 for PV.

## 2020-01-15 ENCOUNTER — Other Ambulatory Visit: Payer: Self-pay

## 2020-01-15 ENCOUNTER — Ambulatory Visit (AMBULATORY_SURGERY_CENTER): Payer: Self-pay | Admitting: *Deleted

## 2020-01-15 VITALS — Ht 72.0 in | Wt 230.0 lb

## 2020-01-15 DIAGNOSIS — Z86010 Personal history of colonic polyps: Secondary | ICD-10-CM

## 2020-01-15 DIAGNOSIS — Z01818 Encounter for other preprocedural examination: Secondary | ICD-10-CM

## 2020-01-15 NOTE — Progress Notes (Signed)

## 2020-01-22 ENCOUNTER — Encounter: Payer: Medicaid Other | Admitting: Internal Medicine

## 2020-02-05 ENCOUNTER — Encounter: Payer: Medicaid Other | Admitting: Internal Medicine

## 2020-02-18 ENCOUNTER — Encounter: Payer: Medicaid Other | Admitting: Internal Medicine

## 2020-05-26 ENCOUNTER — Encounter: Payer: Self-pay | Admitting: Internal Medicine

## 2020-07-17 ENCOUNTER — Encounter: Payer: Medicaid Other | Admitting: Internal Medicine

## 2020-07-27 ENCOUNTER — Other Ambulatory Visit: Payer: Self-pay

## 2020-07-27 ENCOUNTER — Ambulatory Visit (AMBULATORY_SURGERY_CENTER): Payer: Self-pay | Admitting: *Deleted

## 2020-07-27 VITALS — Ht 71.25 in | Wt 212.0 lb

## 2020-07-27 DIAGNOSIS — Z86010 Personal history of colonic polyps: Secondary | ICD-10-CM

## 2020-07-27 MED ORDER — SUPREP BOWEL PREP KIT 17.5-3.13-1.6 GM/177ML PO SOLN: 1.0000 | Freq: Once | ORAL | 0 refills | Status: AC

## 2020-07-27 NOTE — Progress Notes (Signed)
Completed covid vaccines in September per pt  Per pt- he is unsure of when he had his last seizure "a few years ago"- none in 2021  Pt is aware that care partner will wait in the car during procedure; if they feel like they will be too hot or cold to wait in the car; they may wait in the 4 th floor lobby. Patient is aware to bring only one care partner. We want them to wear a mask (we do not have any that we can provide them), practice social distancing, and we will check their temperatures when they get here.  I did remind the patient that their care partner needs to stay in the parking lot the entire time and have a cell phone available, we will call them when the pt is ready for discharge. Patient will wear mask into building.    No trouble with anesthesia, difficulty with intubation or hx/fam hx of malignant hyperthermia per pt   No egg or soy allergy  No home oxygen use   No medications for weight loss taken  Pt denies constipation issues

## 2020-08-13 DIAGNOSIS — Z86010 Personal history of colonic polyps: Secondary | ICD-10-CM

## 2020-08-13 DIAGNOSIS — Z860101 Personal history of adenomatous and serrated colon polyps: Secondary | ICD-10-CM

## 2020-08-13 HISTORY — DX: Personal history of adenomatous and serrated colon polyps: Z86.0101

## 2020-08-13 HISTORY — DX: Personal history of colonic polyps: Z86.010

## 2020-08-14 ENCOUNTER — Other Ambulatory Visit: Payer: Self-pay

## 2020-08-14 ENCOUNTER — Encounter: Payer: Self-pay | Admitting: Internal Medicine

## 2020-08-14 ENCOUNTER — Ambulatory Visit (AMBULATORY_SURGERY_CENTER): Payer: Medicaid Other | Admitting: Internal Medicine

## 2020-08-14 VITALS — BP 135/78 | HR 82 | Temp 97.8°F | Resp 15 | Ht 71.0 in | Wt 212.0 lb

## 2020-08-14 DIAGNOSIS — Z86010 Personal history of colonic polyps: Secondary | ICD-10-CM | POA: Diagnosis not present

## 2020-08-14 DIAGNOSIS — D125 Benign neoplasm of sigmoid colon: Secondary | ICD-10-CM | POA: Diagnosis not present

## 2020-08-14 MED ORDER — SODIUM CHLORIDE 0.9 % IV SOLN: 500.0000 mL | Freq: Once | INTRAVENOUS | Status: DC

## 2020-08-14 NOTE — Op Note (Signed)
[facility] Patient Name: Jeremiah Mcguire Procedure Date: 08/14/2020 11:51 AM MRN: [id number] Endoscopist: Gatha Mayer , MD Age: 55 Referring MD:  Date of Birth: Nov 15, 1965 Gender: Male Account #: [id number] Procedure:                Colonoscopy Indications:              High risk colon cancer surveillance: Personal                            history of colonic polyps, Last colonoscopy: 2011 Medicines:                Propofol per Anesthesia, Monitored Anesthesia Care Procedure:                Pre-Anesthesia Assessment:                           - Prior to the procedure, a History and Physical                            was performed, and patient medications and                            allergies were reviewed. The patient's tolerance of                            previous anesthesia was also reviewed. The risks                            and benefits of the procedure and the sedation                            options and risks were discussed with the patient.                            All questions were answered, and informed consent                            was obtained. Prior Anticoagulants: The patient has                            taken no previous anticoagulant or antiplatelet                            agents. ASA Grade Assessment: III - A patient with                            severe systemic disease. After reviewing the risks                            and benefits, the patient was deemed in                            satisfactory condition to undergo the procedure.  After obtaining informed consent, the colonoscope                            was passed under direct vision. Throughout the                            procedure, the patient's blood pressure, pulse, and                            oxygen saturations were monitored continuously. The                            Colonoscope was introduced through the anus and                             advanced to the the cecum, identified by                            appendiceal orifice and ileocecal valve. The                            colonoscopy was performed without difficulty. The                            patient tolerated the procedure well. The quality                            of the bowel preparation was excellent. The                            ileocecal valve, appendiceal orifice, and rectum                            were photographed. The bowel preparation used was                            SUPREP via split dose instruction. Scope In: 12:07:40 PM Scope Out: 12:18:33 PM Scope Withdrawal Time: 0 hours 8 minutes 24 seconds  Total Procedure Duration: 0 hours 10 minutes 53 seconds  Findings:                 The perianal and digital rectal examinations were                            normal. Pertinent negatives include normal prostate                            (size, shape, and consistency).                           Two sessile polyps were found in the sigmoid colon.                            The polyps were diminutive in size. These polyps  were removed with a cold snare. Resection and                            retrieval were complete. Verification of patient                            identification for the specimen was done. Estimated                            blood loss was minimal.                           The exam was otherwise without abnormality on                            direct and retroflexion views. Complications:            No immediate complications. Estimated Blood Loss:     Estimated blood loss was minimal. Impression:               - Two diminutive polyps in the sigmoid colon,                            removed with a cold snare. Resected and retrieved.                           - The examination was otherwise normal on direct                            and retroflexion views.                           -  Personal history of colonic polyps. 3 diminutive                            left hyperplastic polyps Recommendation:           - Patient has a contact number available for                            emergencies. The signs and symptoms of potential                            delayed complications were discussed with the                            patient. Return to normal activities tomorrow.                            Written discharge instructions were provided to the                            patient.                           - Resume previous diet.                           -  Continue present medications.                           - Repeat colonoscopy is recommended. The                            colonoscopy date will be determined after pathology                            results from today's exam become available for                            review. Gatha Mayer, MD 08/14/2020 12:27:35 PM This report has been signed electronically.

## 2020-08-14 NOTE — Patient Instructions (Addendum)
There were 2 very small polyps that I removed.  I will get them analyzed and we will determine what they were and what that means.  I think it will probably be another 10 years before you need a colonoscopy again. Resume previous diet and medications.  I appreciate the opportunity to care for you. Gatha Mayer, MD, FACG YOU HAD AN ENDOSCOPIC PROCEDURE TODAY AT [facility]:   Refer to the procedure report that was given to you for any specific questions about what was found during the examination.  If the procedure report does not answer your questions, please call your gastroenterologist to clarify.  If you requested that your care partner not be given the details of your procedure findings, then the procedure report has been included in a sealed envelope for you to review at your convenience later.  YOU SHOULD EXPECT: Some feelings of bloating in the abdomen. Passage of more gas than usual.  Walking can help get rid of the air that was put into your GI tract during the procedure and reduce the bloating. If you had a lower endoscopy (such as a colonoscopy or flexible sigmoidoscopy) you may notice spotting of blood in your stool or on the toilet paper. If you underwent a bowel prep for your procedure, you may not have a normal bowel movement for a few days.  Please Note:  You might notice some irritation and congestion in your nose or some drainage.  This is from the oxygen used during your procedure.  There is no need for concern and it should clear up in a day or so.  SYMPTOMS TO REPORT IMMEDIATELY:   Following lower endoscopy (colonoscopy or flexible sigmoidoscopy):  Excessive amounts of blood in the stool  Significant tenderness or worsening of abdominal pains  Swelling of the abdomen that is new, acute  Fever of 100F or higher   For urgent or emergent issues, a gastroenterologist can be reached at any hour by calling [phone]. Do not use MyChart messaging for  urgent concerns.    DIET:  We do recommend a small meal at first, but then you may proceed to your regular diet.  Drink plenty of fluids but you should avoid alcoholic beverages for 24 hours.  ACTIVITY:  You should plan to take it easy for the rest of today and you should NOT DRIVE or use heavy machinery until tomorrow (because of the sedation medicines used during the test).    FOLLOW UP: Our staff will call the number listed on your records 48-72 hours following your procedure to check on you and address any questions or concerns that you may have regarding the information given to you following your procedure. If we do not reach you, we will leave a message.  We will attempt to reach you two times.  During this call, we will ask if you have developed any symptoms of COVID 19. If you develop any symptoms (ie: fever, flu-like symptoms, shortness of breath, cough etc.) before then, please call [phone].  If you test positive for Covid 19 in the 2 weeks post procedure, please call and report this information to [place].    If any biopsies were taken you will be contacted by phone or by letter within the next 1-3 weeks.  Please call us at [phone] if you have not heard about the biopsies in 3 weeks.    SIGNATURES/CONFIDENTIALITY: You and/or your care partner have signed paperwork which will be entered into  your electronic medical record.  These signatures attest to the fact that that the information above on your After Visit Summary has been reviewed and is understood.  Full responsibility of the confidentiality of this discharge information lies with you and/or your care-partner.

## 2020-08-14 NOTE — Progress Notes (Signed)
Called to room to assist during endoscopic procedure.  Patient ID and intended procedure confirmed with present staff. Received instructions for my participation in the procedure from the performing physician.  

## 2020-08-14 NOTE — Progress Notes (Signed)
Medical history reviewed with no changes since PV. VS assessed by C.W 

## 2020-08-14 NOTE — Progress Notes (Signed)
Report to PACU, RN, vss, BBS= Clear.  

## 2020-08-18 ENCOUNTER — Telehealth: Payer: Self-pay

## 2020-08-18 NOTE — Telephone Encounter (Signed)
  Follow up Call-  Call back number 08/14/2020  Post procedure Call Back phone  # [phone]  Permission to leave phone message Yes  Some recent data might be hidden     Follow up call made.  NAULM

## 2020-08-21 ENCOUNTER — Encounter: Payer: Self-pay | Admitting: Internal Medicine

## 2021-11-09 ENCOUNTER — Ambulatory Visit: Payer: Medicaid Other | Admitting: Dietician

## 2022-01-14 ENCOUNTER — Encounter: Payer: Medicaid Other | Attending: Nurse Practitioner | Admitting: Dietician

## 2022-04-12 ENCOUNTER — Ambulatory Visit: Payer: Medicaid Other | Admitting: Podiatry

## 2022-04-12 ENCOUNTER — Ambulatory Visit (INDEPENDENT_AMBULATORY_CARE_PROVIDER_SITE_OTHER): Payer: Medicaid Other

## 2022-04-12 DIAGNOSIS — E1142 Type 2 diabetes mellitus with diabetic polyneuropathy: Secondary | ICD-10-CM | POA: Diagnosis not present

## 2022-04-12 DIAGNOSIS — M79674 Pain in right toe(s): Secondary | ICD-10-CM | POA: Diagnosis not present

## 2022-04-12 DIAGNOSIS — M7751 Other enthesopathy of right foot: Secondary | ICD-10-CM

## 2022-04-12 DIAGNOSIS — B351 Tinea unguium: Secondary | ICD-10-CM | POA: Diagnosis not present

## 2022-04-12 DIAGNOSIS — M779 Enthesopathy, unspecified: Secondary | ICD-10-CM

## 2022-04-12 DIAGNOSIS — M79675 Pain in left toe(s): Secondary | ICD-10-CM | POA: Diagnosis not present

## 2022-04-12 DIAGNOSIS — M7752 Other enthesopathy of left foot: Secondary | ICD-10-CM | POA: Diagnosis not present

## 2022-04-12 MED ORDER — GABAPENTIN 300 MG PO CAPS: 300.0000 mg | ORAL_CAPSULE | Freq: Every day | ORAL | 0 refills | Status: AC

## 2022-04-13 NOTE — Progress Notes (Signed)
  Subjective:  Patient ID: Jeremiah Mcguire, male    DOB: April 07, 1966,  MRN: [id number]  Chief Complaint  Patient presents with   Foot Pain    Sharp pain to bilateral feet starting from the top of the foot radiating down to the toes- BS 160 this morning.     56 y.o. male presents with the above complaint. History confirmed with patient.  He is diabetic he is getting his A1c down at about 8% now.  It was 14% last year  Objective:  Physical Exam: warm, good capillary refill, no trophic changes or ulcerative lesions, normal DP and PT pulses, and normal monofilament exam. Left Foot: dystrophic yellowed discolored nail plates with subungual debris Right Foot: dystrophic yellowed discolored nail plates with subungual debris  Assessment:   1. Diabetic peripheral neuropathy (HCC)   2. Pain due to onychomycosis of toenails of both feet      Plan:  Patient was evaluated and treated and all questions answered.  Patient educated on diabetes. Discussed proper diabetic foot care and discussed risks and complications of disease. Educated patient in depth on reasons to return to the office immediately should he/she discover anything concerning or new on the feet. All questions answered. Discussed proper shoes as well.   Suspect he has diabetic peripheral neuropathy.  Thankfully his protective sensation is intact to monofilament.  We discussed treatment of his neuropathy.  He is interested in trying gabapentin.  I prescribed him 300 mg nightly.  He will begin this and let me know if he would like to ramp his dosage at some point if it is not improving.   Discussed the etiology and treatment options for the condition in detail with the patient. Educated patient on the topical and oral treatment options for mycotic nails. Recommended debridement of the nails today. Sharp and mechanical debridement performed of all painful and mycotic nails today. Nails debrided in length and thickness using a nail  nipper to level of comfort. Discussed treatment options including appropriate shoe gear. Follow up as needed for painful nails.    Return if symptoms worsen or fail to improve.

## 2022-04-28 ENCOUNTER — Telehealth: Payer: Self-pay | Admitting: *Deleted

## 2022-04-28 NOTE — Telephone Encounter (Signed)
Patient is called and said that his prescription was not sent to pharmacy as discussed during visit.Please advise and send to pharmacy on file.

## 2022-05-10 ENCOUNTER — Telehealth: Payer: Self-pay | Admitting: *Deleted

## 2022-05-10 NOTE — Telephone Encounter (Signed)
error 

## 2022-05-21 ENCOUNTER — Telehealth: Payer: Self-pay | Admitting: *Deleted

## 2022-05-21 NOTE — Telephone Encounter (Signed)
Confirmed that receipt was sent from pharmacy.

## 2023-08-25 ENCOUNTER — Encounter: Payer: Self-pay | Admitting: Podiatry

## 2023-08-25 ENCOUNTER — Ambulatory Visit (INDEPENDENT_AMBULATORY_CARE_PROVIDER_SITE_OTHER): Payer: MEDICAID | Admitting: Podiatry

## 2023-08-25 DIAGNOSIS — M79675 Pain in left toe(s): Secondary | ICD-10-CM

## 2023-08-25 DIAGNOSIS — B351 Tinea unguium: Secondary | ICD-10-CM

## 2023-08-25 DIAGNOSIS — E1142 Type 2 diabetes mellitus with diabetic polyneuropathy: Secondary | ICD-10-CM | POA: Diagnosis not present

## 2023-08-25 DIAGNOSIS — M79674 Pain in right toe(s): Secondary | ICD-10-CM

## 2023-08-25 NOTE — Progress Notes (Signed)
This patient returns to my office for at risk foot care.  This patient requires this care by a professional since this patient will be at risk due to having diabetic neuropathy.  This patient is unable to cut nails himself since the patient cannot reach his nails.These nails are painful walking and wearing shoes.  This patient presents for at risk foot care today.  General Appearance  Alert, conversant and in no acute stress.  Vascular  Dorsalis pedis and posterior tibial  pulses are palpable  bilaterally.  Capillary return is within normal limits  bilaterally. Temperature is within normal limits  bilaterally.  Neurologic  Senn-Weinstein monofilament wire test within normal limits  bilaterally. Muscle power within normal limits bilaterally.  Nails Thick disfigured discolored nails with subungual debris  from hallux to fifth toes bilaterally. No evidence of bacterial infection or drainage bilaterally.  Orthopedic  No limitations of motion  feet .  No crepitus or effusions noted.  No bony pathology or digital deformities noted.  Skin  normotropic skin with no porokeratosis noted bilaterally.  No signs of infections or ulcers noted.     Onychomycosis  Pain in right toes  Pain in left toes  Consent was obtained for treatment procedures.   Mechanical debridement of nails 1-5  bilaterally performed with a nail nipper.  Filed with dremel without incident.    Return office visit   3 months                   Told patient to return for periodic foot care and evaluation due to potential at risk complications.   Helane Gunther DPM

## 2024-01-11 ENCOUNTER — Ambulatory Visit: Payer: MEDICAID | Admitting: Podiatry

## 2024-02-06 ENCOUNTER — Ambulatory Visit: Payer: MEDICAID | Admitting: Podiatry

## 2024-02-07 ENCOUNTER — Telehealth: Payer: Self-pay | Admitting: *Deleted

## 2024-02-07 NOTE — Telephone Encounter (Signed)
 Patient called regarding diabetic shoes.

## 2024-02-24 ENCOUNTER — Other Ambulatory Visit: Payer: Self-pay

## 2024-02-24 ENCOUNTER — Encounter (HOSPITAL_COMMUNITY): Payer: Self-pay | Admitting: *Deleted

## 2024-02-24 ENCOUNTER — Ambulatory Visit (HOSPITAL_COMMUNITY)
Admission: EM | Admit: 2024-02-24 | Discharge: 2024-02-24 | Disposition: A | Discharge status: Home / Self Care | Payer: MEDICAID

## 2024-02-24 DIAGNOSIS — B351 Tinea unguium: Secondary | ICD-10-CM

## 2024-02-24 NOTE — ED Triage Notes (Signed)
 PT reports tingling in his toes . Pt reports the tingling moves from toe to toe,

## 2024-02-24 NOTE — Discharge Instructions (Addendum)
 Recommend follow up with podiatry

## 2024-02-24 NOTE — ED Provider Notes (Signed)
 [facility]    CSN: [id number] Arrival date & time: 02/24/24  1340      History   Chief Complaint Chief Complaint  Patient presents with   Nail Problem    HPI Jeremiah Mcguire is a 58 y.o. male.   Patient presents requesting regular foot exam.  He states numbness and tingling to toes intermittently.  He does follow with podiatrist.  Last seen in January.  Has a history of onychomycosis.  Has a history of diabetes.    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   Gonorrhea    Hx of adenomatous polyp of colon 08/13/2020   Hyperlipidemia    Migraine    probably weekly (07/18/2016)   Psychosis (HCC)    Questioned schizophrenia   Seizure disorder (HCC)    in the past; don't know what related to; don't know when last one was (07/18/2016)   Septic bursitis of elbow, left 07/18/2016 admission   Substance abuse (HCC)    pt denies this hx on 07/18/2016   Type II diabetes mellitus (HCC)     Patient Active Problem List   Diagnosis Date Noted   Hx of adenomatous polyp of colon 08/13/2020   Septic olecranon bursitis of left elbow 07/18/2016   Cellulitis 07/18/2016   Diabetes mellitus (HCC)    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 Lupita Commander   ESOPHAGOGASTRODUODENOSCOPY     OLECRANON BURSECTOMY Left 07/20/2016   Procedure: LEFT OLECRANON BURSECTOMY;  Surgeon: Kay CHRISTELLA Cummins, MD;  Location: [facility];  Service: Orthopedics;  Laterality: Left;   SKULL FRACTURE ELEVATION  1990s       Home Medications    Prior to Admission medications    Medication Sig Start Date End Date Taking? Authorizing Provider  ARIPiprazole  (ABILIFY ) 30 MG tablet Take 30 mg by mouth daily.   Yes [provider]  atorvastatin  (LIPITOR) 20 MG tablet Take 20 mg by mouth daily.   Yes [provider]  benztropine  (COGENTIN ) 1 MG tablet Take 1 mg by mouth 2 (two) times daily.   Yes [provider]  blood glucose meter kit and supplies Dispense based on patient and insurance preference. Use up to four times daily as directed. (Dx: E11.9) 07/22/16  Yes Chapa, Clanford L, MD  insulin  aspart (NOVOLOG  FLEXPEN) 100 UNIT/ML FlexPen Inject 8 Units into the skin 3 (three) times daily with meals. 07/22/16  Yes Kussman, Clanford L, MD  Insulin  Glargine (LANTUS  SOLOSTAR) 100 UNIT/ML Solostar Pen Inject 20 Units into the skin daily at 10 pm. 07/22/16  Yes Palmeri, Clanford L, MD  cephALEXin  (KEFLEX ) 500 MG capsule Take 1 capsule (500 mg total) by mouth 4 (four) times daily. 05/22/18   Charlyn Sora, MD  cholecalciferol (VITAMIN D) 1000 units tablet Take 1,000 Units by mouth daily.    [provider]  doxycycline  (VIBRAMYCIN ) 100 MG capsule Take 1 capsule (100 mg total) by mouth 2 (two) times daily. 05/22/18   Charlyn Sora, MD  gabapentin  (NEURONTIN ) 300 MG capsule Take 1 capsule (300 mg total)  by mouth at bedtime. 04/12/22 07/11/22  McDonald, Juliene SAUNDERS, DPM  glucagon  (GLUCAGON  EMERGENCY) 1 MG injection Inject 1 mg into the vein once as needed. 07/22/16   Tapper, Clanford L, MD  Insulin  Glargine (LANTUS  [place]) Inject 25 Units into the skin 2 (two) times daily.    [provider]  Insulin  Pen Needle 31G X 5 MM MISC Use as directed with insulin  pens 07/22/16   Pauwels, Clanford L, MD  Omega-3 Fatty Acids (FISH OIL) 1000 MG CPDR Take 1,000 mg by mouth daily.    [provider]  Polyethylene Glycol 3350  (MIRALAX  PO) Take 238 g by mouth once. Colonoscopy bowel prep    [provider]  QUEtiapine  (SEROQUEL ) 400 MG tablet  Take 400 mg by mouth 2 (two) times daily.    [provider]  traMADol  (ULTRAM ) 50 MG tablet Take 1 tablet (50 mg total) by mouth every 6 (six) hours as needed. 07/12/16   Randol Simmonds, MD    Family History Family History  Problem Relation Age of Onset   Diabetes Mother    Hypertension Other    CAD Neg Hx    Cancer Neg Hx    Colon cancer Neg Hx    Esophageal cancer Neg Hx    Stomach cancer Neg Hx    Rectal cancer Neg Hx     Social History Social History   Tobacco Use   Smoking status: Never   Smokeless tobacco: Never  Vaping Use   Vaping status: Never Used  Substance Use Topics   Alcohol use: No   Drug use: Not Currently     Allergies   Ascorbic acid, Motrin [ibuprofen], Other, Citric acid, and Tomato   Review of Systems Review of Systems  Constitutional:  Negative for chills and fever.  HENT:  Negative for ear pain and sore throat.   Eyes:  Negative for pain and visual disturbance.  Respiratory:  Negative for cough and shortness of breath.   Cardiovascular:  Negative for chest pain and palpitations.  Gastrointestinal:  Negative for abdominal pain and vomiting.  Genitourinary:  Negative for dysuria and hematuria.  Musculoskeletal:  Negative for arthralgias and back pain.  Skin:  Negative for color change and rash.  Neurological:  Negative for seizures and syncope.  All other systems reviewed and are negative.    Physical Exam Triage Vital Signs ED Triage Vitals  Encounter Vitals Group     BP 02/24/24 1401 134/85     Girls Systolic BP Percentile --      Girls Diastolic BP Percentile --      Boys Systolic BP Percentile --      Boys Diastolic BP Percentile --      Pulse Rate 02/24/24 1401 95     Resp 02/24/24 1401 18     Temp 02/24/24 1401 97.8 F (36.6 C)     Temp src --      SpO2 02/24/24 1401 96 %     Weight --      Height --      Head Circumference --      Peak Flow --      Pain Score 02/24/24 1358 8     Pain Loc --      Pain Education  --      Exclude from Growth Chart --    No data found.  Updated Vital Signs BP 134/85   Pulse 95   Temp 97.8 F (36.6 C)   Resp 18  SpO2 96%   Visual Acuity Right Eye Distance:   Left Eye Distance:   Bilateral Distance:    Right Eye Near:   Left Eye Near:    Bilateral Near:     Physical Exam Vitals and nursing note reviewed.  Constitutional:      General: He is not in acute distress.    Appearance: He is well-developed.  HENT:     Head: Normocephalic and atraumatic.  Eyes:     Conjunctiva/sclera: Conjunctivae normal.  Cardiovascular:     Rate and Rhythm: Normal rate and regular rhythm.     Heart sounds: No murmur heard. Pulmonary:     Effort: Pulmonary effort is normal. No respiratory distress.     Breath sounds: Normal breath sounds.  Abdominal:     Palpations: Abdomen is soft.     Tenderness: There is no abdominal tenderness.  Musculoskeletal:        General: No swelling.     Cervical back: Neck supple.  Feet:     Comments: No wounds noted.  Onychomycosis to all toes.  This is a chronic issue.  Thick overgrown toenails in clinic today. Skin:    General: Skin is warm and dry.     Capillary Refill: Capillary refill takes less than 2 seconds.  Neurological:     Mental Status: He is alert.  Psychiatric:        Mood and Affect: Mood normal.      UC Treatments / Results  Labs (all labs ordered are listed, but only abnormal results are displayed) Labs Reviewed - No data to display  EKG   Radiology No results found.  Procedures Procedures (including critical care time)  Medications Ordered in UC Medications - No data to display  Initial Impression / Assessment and Plan / UC Course  I have reviewed the triage vital signs and the nursing notes.  Pertinent labs & imaging results that were available during my care of the patient were reviewed by me and considered in my medical decision making (see chart for details).     Patient follows with  podiatry.  Last seen in January.  Advised patient to call Monday to make an appointment. Final Clinical Impressions(s) / UC Diagnoses   Final diagnoses:  Onychomycosis of toenail     Discharge Instructions      Recommend follow up with podiatry.     ED Prescriptions   None    PDMP not reviewed this encounter.   Ward, Harlene PEDLAR, PA-C 02/24/24 1444

## 2024-03-20 ENCOUNTER — Encounter: Payer: Self-pay | Admitting: Podiatry

## 2024-03-20 ENCOUNTER — Ambulatory Visit (INDEPENDENT_AMBULATORY_CARE_PROVIDER_SITE_OTHER): Payer: MEDICAID | Admitting: Podiatry

## 2024-03-20 DIAGNOSIS — M79674 Pain in right toe(s): Secondary | ICD-10-CM

## 2024-03-20 DIAGNOSIS — M79675 Pain in left toe(s): Secondary | ICD-10-CM | POA: Diagnosis not present

## 2024-03-20 DIAGNOSIS — E1142 Type 2 diabetes mellitus with diabetic polyneuropathy: Secondary | ICD-10-CM | POA: Diagnosis not present

## 2024-03-20 DIAGNOSIS — B351 Tinea unguium: Secondary | ICD-10-CM

## 2024-03-20 NOTE — Progress Notes (Signed)
 This patient returns to my office for at risk foot care.  This patient requires this care by a professional since this patient will be at risk due to having diabetic neuropathy.  This patient is unable to cut nails himself since the patient cannot reach his nails.These nails are painful walking and wearing shoes.  This patient presents for at risk foot care today.  General Appearance  Alert, conversant and in no acute stress.  Vascular  Dorsalis pedis and posterior tibial  pulses are palpable  bilaterally.  Capillary return is within normal limits  bilaterally. Temperature is within normal limits  bilaterally.  Neurologic  Senn-Weinstein monofilament wire test within normal limits  bilaterally. Muscle power within normal limits bilaterally.  Nails Thick disfigured discolored nails with subungual debris  from hallux to fifth toes bilaterally. No evidence of bacterial infection or drainage bilaterally.  Orthopedic  No limitations of motion  feet .  No crepitus or effusions noted.  No bony pathology or digital deformities noted.  Skin  normotropic skin with no porokeratosis noted bilaterally.  No signs of infections or ulcers noted.     Onychomycosis  Pain in right toes  Pain in left toes  Consent was obtained for treatment procedures.   Mechanical debridement of nails 1-5  bilaterally performed with a nail nipper.  Filed with dremel without incident.  Discuused diabetic shoes with this patient.     Return office visit    3 months                  Told patient to return for periodic foot care and evaluation due to potential at risk complications.   Cordella Bold DPM

## 2024-04-16 NOTE — Congregational Nurse Program (Signed)
  Dept: [phone]   Congregational Nurse Program Note  Date of Encounter: 04/16/2024  Received call from Ms. Lenon, resident's PCP.  Resident is to take Novolog  70/30 insulin  with breakfast and dinner meals, Lantus  at HS.  Unable to reach him by telephone, sent email asking him to take insulin  as prescribed, call PCP office if questions and come to clinic next week. 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 Yes    Student Assistance N/A    Location [place]    Encounter Setting Phone/Text/Email    Population Status Unknown   Has apartment at [facility]    Insurance/Financial Assistance Referral N/A    Medication Patient Medications Reviewed    Medical Provider Yes    Screening Referrals Made N/A    Medical Referrals Made N/A    Medical Appointment Completed N/A    CNP Interventions Advocate/Support;Counsel    Screenings CN Performed N/A    ED Visit Averted N/A    Life-Saving Intervention Made N/A

## 2024-04-16 NOTE — Congregational Nurse Program (Signed)
  Dept: [phone]   Congregational Nurse Program Note  Date of Encounter: 04/16/2024  Clinic visit after having blood glucose checked by nursing student, 467.  Previously blood glucose of over 300 when last checked by the student two weeks ago.  Reviewed symptoms of hyperglycemia, resident denies all symptoms. Left message with PCP office regarding insulin  and elevated blood glucose.  Discussed intake for this week, has had high carbohydrate and high fat foods.  Is not taking insulin  with each meal as prescribed. Drank 16.9 oz water during visit, advised to drink another bottle of water when he goes to his apartment and take insulin  with dinner and HS dose.  Advised to call MD office if any changes in his condition.  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 Yes    Fish farm manager    Location Patient [place]    Encounter Setting CN site    Population Status Unknown   Has apartment at [facility]    Insurance/Financial Assistance Referral N/A    Medication Patient Medications Reviewed    Medical Provider Yes    Screening Referrals Made N/A    Medical Referrals Made Non-[facility] PCP/Clinic     Medical Appointment Completed N/A    CNP Interventions Advocate/Support;Educate;Navigate Healthcare System;Counsel    Screenings CN Performed Blood Pressure    ED Visit Averted Yes    Life-Saving Intervention Made N/A

## 2024-04-29 NOTE — Congregational Nurse Program (Signed)
  Dept: [phone]   Congregational Nurse Program Note  Date of Encounter: 04/23/2024  Resident referred to his PCP for follow-up d/t elevated blood pressure and elevated blood glucose.  Blood gluocse checked by [employer] [facility] nursing student. 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 Yes    Student Assistance N/A    Location Patient [place]    Encounter Setting Phone/Text/Email    Population Status Unknown   Has apartment at [facility]    Insurance/Financial Assistance Referral N/A    Medication Patient Medications Reviewed    Medical Provider Yes    Screening Referrals Made N/A    Medical Referrals Made N/A    Medical Appointment Completed N/A    CNP Interventions Advocate/Support;Counsel    Screenings CN Performed N/A    ED Visit Averted N/A    Life-Saving Intervention Made N/A

## 2024-06-20 ENCOUNTER — Encounter: Payer: Self-pay | Admitting: Podiatry

## 2024-06-20 ENCOUNTER — Ambulatory Visit (INDEPENDENT_AMBULATORY_CARE_PROVIDER_SITE_OTHER): Payer: MEDICAID | Admitting: Podiatry

## 2024-06-20 DIAGNOSIS — B351 Tinea unguium: Secondary | ICD-10-CM

## 2024-06-20 DIAGNOSIS — M79675 Pain in left toe(s): Secondary | ICD-10-CM

## 2024-06-20 DIAGNOSIS — M79674 Pain in right toe(s): Secondary | ICD-10-CM | POA: Diagnosis not present

## 2024-06-20 DIAGNOSIS — E1142 Type 2 diabetes mellitus with diabetic polyneuropathy: Secondary | ICD-10-CM | POA: Diagnosis not present

## 2024-06-20 NOTE — Progress Notes (Signed)
 This patient returns to my office for at risk foot care.  This patient requires this care by a professional since this patient will be at risk due to having diabetic neuropathy.  This patient is unable to cut nails himself since the patient cannot reach his nails.These nails are painful walking and wearing shoes.  This patient presents for at risk foot care today.  General Appearance  Alert, conversant and in no acute stress.  Vascular  Dorsalis pedis and posterior tibial  pulses are palpable  bilaterally.  Capillary return is within normal limits  bilaterally. Temperature is within normal limits  bilaterally.  Neurologic  Senn-Weinstein monofilament wire test within normal limits  bilaterally. Muscle power within normal limits bilaterally.  Nails Thick disfigured discolored nails with subungual debris  from hallux to fifth toes bilaterally. No evidence of bacterial infection or drainage bilaterally.  Orthopedic  No limitations of motion  feet .  No crepitus or effusions noted.  No bony pathology or digital deformities noted.  Skin  normotropic skin with no porokeratosis noted bilaterally.  No signs of infections or ulcers noted.     Onychomycosis  Pain in right toes  Pain in left toes  Consent was obtained for treatment procedures.   Mechanical debridement of nails 1-5  bilaterally performed with a nail nipper.  Filed with dremel without incident.    Return office visit   3 months                   Told patient to return for periodic foot care and evaluation due to potential at risk complications.   Helane Gunther DPM

## 2024-08-06 NOTE — Congregational Nurse Program (Signed)
" °  Dept: [phone]   Congregational Nurse Program Note  Date of Encounter: 07/23/2024  Clinic visit to check blood pressure, BP 119/79, pulse 100 and regular, respirations 16, O2 Sat 97%.  Discussed normal blood pressure and pulse rates. 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   Gonorrhea    Hx of adenomatous polyp of colon 08/13/2020   Hyperlipidemia    Migraine    probably weekly (07/18/2016)   Psychosis (HCC)    Questioned schizophrenia   Seizure disorder (HCC)    in the past; don't know what related to; don't know when last one was (07/18/2016)   Septic bursitis of elbow, left 07/18/2016 admission   Substance abuse (HCC)    pt denies this hx on 07/18/2016   Type II diabetes mellitus [facility])     Encounter Details:  Community Questionnaire - 07/23/24 1703       Questionnaire   Ask client: Do you give verbal consent for me to treat you today? Yes    Student Assistance N/A    Location Patient [place]    Encounter Setting CN site    Population Status Unknown   Has apartment at [facility]    Insurance/Financial Assistance Referral N/A    Medication Patient Medications Reviewed    Medical Provider Yes    Medical Referrals Made N/A    Medical Appointment Completed N/A    Screenings CN Performed (remember to also record results) Blood Pressure;Blood Glucose    CNP Interventions Advocate/Support;Counsel;Educate;Case Management    ED Visit Averted Yes    RETIRED Screening Referrals Made N/A      Questionnaire   Life-Saving Intervention Made N/A            "

## 2024-10-08 ENCOUNTER — Ambulatory Visit: Payer: MEDICAID | Admitting: Podiatry
